# Patient Record
Sex: Female | Born: 1980 | Race: White | Hispanic: No | Marital: Married | Smoking: Current every day smoker
Health system: Southern US, Community
[De-identification: ages and names within clinical notes are randomized; demographics above are authoritative.]

## PROBLEM LIST (undated history)

## (undated) DIAGNOSIS — J45909 Unspecified asthma, uncomplicated: Secondary | ICD-10-CM

## (undated) DIAGNOSIS — N200 Calculus of kidney: Secondary | ICD-10-CM

---
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
## 2003-03-30 ENCOUNTER — Emergency Department (HOSPITAL_COMMUNITY): Admission: EM | Admit: 2003-03-30 | Discharge: 2003-03-30 | Payer: Self-pay | Admitting: Emergency Medicine

## 2004-07-13 ENCOUNTER — Inpatient Hospital Stay (HOSPITAL_COMMUNITY): Admission: EM | Admit: 2004-07-13 | Discharge: 2004-07-16 | Payer: Self-pay | Admitting: Emergency Medicine

## 2011-11-19 ENCOUNTER — Emergency Department (HOSPITAL_COMMUNITY)
Admission: EM | Admit: 2011-11-19 | Discharge: 2011-11-20 | Disposition: A | Discharge status: Home / Self Care | Payer: Self-pay | Attending: Emergency Medicine | Admitting: Emergency Medicine

## 2011-11-19 ENCOUNTER — Encounter (HOSPITAL_COMMUNITY): Payer: Self-pay | Admitting: *Deleted

## 2011-11-19 DIAGNOSIS — R112 Nausea with vomiting, unspecified: Secondary | ICD-10-CM

## 2011-11-19 DIAGNOSIS — J45909 Unspecified asthma, uncomplicated: Secondary | ICD-10-CM | POA: Insufficient documentation

## 2011-11-19 DIAGNOSIS — R197 Diarrhea, unspecified: Secondary | ICD-10-CM | POA: Insufficient documentation

## 2011-11-19 DIAGNOSIS — F17200 Nicotine dependence, unspecified, uncomplicated: Secondary | ICD-10-CM | POA: Insufficient documentation

## 2011-11-19 DIAGNOSIS — Z79899 Other long term (current) drug therapy: Secondary | ICD-10-CM | POA: Insufficient documentation

## 2011-11-19 DIAGNOSIS — R509 Fever, unspecified: Secondary | ICD-10-CM | POA: Insufficient documentation

## 2011-11-19 HISTORY — DX: Unspecified asthma, uncomplicated: J45.909

## 2011-11-19 LAB — URINALYSIS, ROUTINE W REFLEX MICROSCOPIC
Glucose, UA: NEGATIVE mg/dL
Hgb urine dipstick: NEGATIVE
Specific Gravity, Urine: 1.025 (ref 1.005–1.030)

## 2011-11-19 LAB — COMPREHENSIVE METABOLIC PANEL
ALT: 16 U/L (ref 0–35)
AST: 24 U/L (ref 0–37)
Albumin: 4.3 g/dL (ref 3.5–5.2)
Calcium: 9.2 mg/dL (ref 8.4–10.5)
Chloride: 100 mEq/L (ref 96–112)
Creatinine, Ser: 0.7 mg/dL (ref 0.50–1.10)
Sodium: 133 mEq/L — ABNORMAL LOW (ref 135–145)

## 2011-11-19 LAB — DIFFERENTIAL
Basophils Absolute: 0 10*3/uL (ref 0.0–0.1)
Basophils Relative: 0 % (ref 0–1)
Eosinophils Relative: 1 % (ref 0–5)
Monocytes Absolute: 0.4 10*3/uL (ref 0.1–1.0)
Neutro Abs: 9.9 10*3/uL — ABNORMAL HIGH (ref 1.7–7.7)

## 2011-11-19 LAB — CBC
HCT: 41.8 % (ref 36.0–46.0)
MCHC: 34.9 g/dL (ref 30.0–36.0)
Platelets: 312 10*3/uL (ref 150–400)
RDW: 12 % (ref 11.5–15.5)

## 2011-11-19 LAB — PREGNANCY, URINE: Preg Test, Ur: NEGATIVE

## 2011-11-19 LAB — URINE MICROSCOPIC-ADD ON

## 2011-11-19 MED ORDER — ONDANSETRON 8 MG PO TBDP
8.0000 mg | ORAL_TABLET | Freq: Once | ORAL | Status: AC
  Administered 2011-11-19: 8 mg via ORAL
  Filled 2011-11-19: qty 1

## 2011-11-19 MED ORDER — SODIUM CHLORIDE 0.9 % IV BOLUS (SEPSIS)
1000.0000 mL | Freq: Once | INTRAVENOUS | Status: AC
  Administered 2011-11-19: 1000 mL via INTRAVENOUS

## 2011-11-19 NOTE — ED Notes (Signed)
Pt states she started having n/v/d around 10am this morning. Pt states she was on her way to work and had to pull over because of nausea. No blood noted in emesis

## 2011-11-19 NOTE — ED Notes (Addendum)
Pt reports constant bilateral leg aching pain from the knees down. She states that a lot of times when she's sick especially during a fever her legs will ache.

## 2011-11-19 NOTE — ED Notes (Signed)
Pt reports having a kidney infection in 2007 and feeling the same way as she does now.

## 2011-11-19 NOTE — ED Provider Notes (Signed)
History     CSN: [id number]  Arrival date & time 11/19/11  1748   First MD Initiated Contact with Patient 11/19/11 2049      Chief Complaint  Patient presents with  . Fever  . Diarrhea  . Emesis    (Consider location/radiation/quality/duration/timing/severity/associated sxs/prior treatment) HPI Comments: Woke this AM with N/V/D and low grade fever   Patient is a 31 y.o. female presenting with fever, diarrhea, and vomiting.  Fever Primary symptoms of the febrile illness include fever, abdominal pain, nausea, vomiting and diarrhea. Primary symptoms do not include headaches, cough, shortness of breath or dysuria. The current episode started today. This is a new problem.  Diarrhea The primary symptoms include fever, abdominal pain, nausea, vomiting and diarrhea. Primary symptoms do not include dysuria.  The illness is also significant for chills. The illness does not include constipation or back pain.  Emesis  Associated symptoms include abdominal pain, chills, diarrhea and a fever. Pertinent negatives include no cough and no headaches.    Past Medical History  Diagnosis Date  . Asthma     History reviewed. No pertinent past surgical history.  No family history on file.  History  Substance Use Topics  . Smoking status: Current Some Day Smoker  . Smokeless tobacco: Not on file  . Alcohol Use: Yes    OB History    Grav Para Term Preterm Abortions TAB SAB Ect Mult Living                  Review of Systems  Constitutional: Positive for fever and chills.  HENT: Negative for congestion.   Respiratory: Negative for cough and shortness of breath.   Gastrointestinal: Positive for nausea, vomiting, abdominal pain and diarrhea. Negative for constipation.  Genitourinary: Negative for dysuria, frequency and vaginal discharge.  Musculoskeletal: Negative for back pain.  Neurological: Negative for dizziness, weakness and headaches.    Allergies  Erythromycin  Home  Medications   Current Outpatient Rx  Name Route Sig Dispense Refill  . ALBUTEROL SULFATE HFA 108 (90 BASE) MCG/ACT IN AERS Inhalation Inhale 2 puffs into the lungs every 6 (six) hours as needed. For shortness of breath.    . IBUPROFEN 200 MG PO TABS Oral Take 400 mg by mouth every 8 (eight) hours as needed. For pain.    Marland Kitchen PROMETHAZINE HCL 25 MG PO TABS Oral Take 0.5 tablets (12.5 mg total) by mouth every 6 (six) hours as needed for nausea. 30 tablet 0    BP 111/70  Pulse 94  Temp 98.4 F (36.9 C) (Oral)  Resp 17  Ht 5\' 4"  (1.626 m)  Wt 154 lb (69.854 kg)  BMI 26.43 kg/m2  SpO2 98%  LMP 11/10/2011  Physical Exam  Constitutional: She appears well-developed.  Eyes: Pupils are equal, round, and reactive to light.  Neck: Normal range of motion.  Cardiovascular: Tachycardia present.   Pulmonary/Chest: Effort normal.  Abdominal: Soft. She exhibits no distension. Bowel sounds are increased. There is tenderness in the suprapubic area.  Musculoskeletal: Normal range of motion.  Neurological: She is alert.  Skin: Skin is warm. No rash noted. There is pallor.    ED Course  Procedures (including critical care time)  Labs Reviewed  CBC - Abnormal; Notable for the following:    WBC 10.9 (*)     All other components within normal limits  DIFFERENTIAL - Abnormal; Notable for the following:    Neutrophils Relative 90 (*)     Neutro Abs 9.9 (*)  Lymphocytes Relative 5 (*)     Lymphs Abs 0.5 (*)     All other components within normal limits  COMPREHENSIVE METABOLIC PANEL - Abnormal; Notable for the following:    Sodium 133 (*)     Glucose, Bld 105 (*)     All other components within normal limits  URINALYSIS, ROUTINE W REFLEX MICROSCOPIC - Abnormal; Notable for the following:    APPearance CLOUDY (*)     Leukocytes, UA SMALL (*)     All other components within normal limits  URINE MICROSCOPIC-ADD ON - Abnormal; Notable for the following:    Squamous Epithelial / LPF FEW (*)      Bacteria, UA FEW (*)     All other components within normal limits  PREGNANCY, URINE   No results found.   1. Nausea and vomiting in adult      Feeling better tolerating PO  MDM  Will check for UTI         Arman Filter, NP 11/20/11 0141  Arman Filter, NP 11/20/11 4782

## 2011-11-20 MED ORDER — PROMETHAZINE HCL 25 MG PO TABS: 12.5000 mg | ORAL_TABLET | Freq: Four times a day (QID) | ORAL | Status: DC | PRN

## 2011-11-20 NOTE — ED Notes (Signed)
Pt able to tolerate fluids. No emesis.

## 2011-11-20 NOTE — ED Provider Notes (Signed)
Medical screening examination/treatment/procedure(s) were performed by non-physician practitioner and as supervising physician I was immediately available for consultation/collaboration.  Librado Guandique L Tatjana Turcott, MD 11/20/11 2354 

## 2011-11-20 NOTE — Discharge Instructions (Signed)
B.R.A.T. Diet Your doctor has recommended the B.R.A.T. diet for you or your child until the condition improves. This is often used to help control diarrhea and vomiting symptoms. If you or your child can tolerate clear liquids, you may have:  Bananas.   Rice.   Applesauce.   Toast (and other simple starches such as crackers, potatoes, noodles).  Be sure to avoid dairy products, meats, and fatty foods until symptoms are better. Fruit juices such as apple, grape, and prune juice can make diarrhea worse. Avoid these. Continue this diet for 2 days or as instructed by your caregiver. Document Released: 05/18/2005 Document Revised: 05/07/2011 Document Reviewed: 11/04/2006 [facility] Patient Information 2012 [place], [place]. Your labs, and urine were checked, today, indicating no sign of infection.  This is most likely a viral syndrome.  Please try to eat light.  For the next 24-48 hours.  U. been given a prescription for Phenergan that can control your nausea, as well.  Please uses as needed.  1 tablet every 6 hours

## 2011-11-20 NOTE — ED Notes (Signed)
Pt denies being nauseated, but pt reports feeling tired and having a headache.

## 2013-05-02 ENCOUNTER — Ambulatory Visit: Payer: Self-pay | Admitting: Internal Medicine

## 2013-05-02 VITALS — BP 122/64 | HR 111 | Temp 99.3°F | Resp 18 | Ht 67.0 in | Wt 159.0 lb

## 2013-05-02 DIAGNOSIS — J029 Acute pharyngitis, unspecified: Secondary | ICD-10-CM

## 2013-05-02 DIAGNOSIS — J45909 Unspecified asthma, uncomplicated: Secondary | ICD-10-CM | POA: Insufficient documentation

## 2013-05-02 LAB — POCT CBC
Granulocyte percent: 84 %G — AB (ref 37–80)
Hemoglobin: 14 g/dL (ref 12.2–16.2)
Lymph, poc: 1.7 (ref 0.6–3.4)
MCHC: 32 g/dL (ref 31.8–35.4)
MPV: 8.2 fL (ref 0–99.8)
POC Granulocyte: 15 — AB (ref 2–6.9)
POC MID %: 6.2 %M (ref 0–12)
RBC: 4.43 M/uL (ref 4.04–5.48)

## 2013-05-02 LAB — POCT RAPID STREP A (OFFICE): Rapid Strep A Screen: NEGATIVE

## 2013-05-02 MED ORDER — AMOXICILLIN 500 MG PO CAPS: 1000.0000 mg | ORAL_CAPSULE | Freq: Two times a day (BID) | ORAL | Status: AC

## 2013-05-02 NOTE — Progress Notes (Signed)
Subjective:  This chart was scribed for Cheryl Sia, MD by Quintella Reichert, ED scribe.  This patient was seen in room [facility] Room 2 and the patient's care was started at 8:33 AM.   Patient ID: Cheryl Delacruz, female    DOB: 01/04/1981, 32 y.o.   MRN: [id number]   HPI  HPI Comments: Cheryl Delacruz is a 32 y.o. female who presents with a 48-hour history of persistent worsening sore throat with associated headache, fever, and generalized body aches.  Headache is generalized but is severe in the frontal region.  Pt also notes a mild cough but she states this is "more or less from the sore throat."  She works in [employer] but denies known sick contacts.  She did not receive a flu shot this year.  [facility]  Past Medical History  Diagnosis Date   Asthma     Allergies  Allergen Reactions   Erythromycin Hives    Prior to Admission medications   Medication Sig Start Date End Date Taking? Authorizing Provider  ibuprofen (ADVIL,MOTRIN) 200 MG tablet Take 400 mg by mouth every 8 (eight) hours as needed. For pain.   Yes Historical Provider, MD  albuterol (PROVENTIL HFA;VENTOLIN HFA) 108 (90 BASE) MCG/ACT inhaler Inhale 2 puffs into the lungs every 6 (six) hours as needed. For shortness of breath.    Historical Provider, MD  promethazine (PHENERGAN) 25 MG tablet Take 0.5 tablets (12.5 mg total) by mouth every 6 (six) hours as needed for nausea. 11/20/11 11/27/11  Arman Filter, NP      Review of Systems  Constitutional: Positive for fever.  HENT: Positive for sore throat.   Respiratory: Positive for cough.   Gastrointestinal: Negative for vomiting.  Musculoskeletal: Positive for myalgias.  Skin: Negative for rash.  Neurological: Positive for headaches.        Objective:   Physical Exam  Nursing note and vitals reviewed. Constitutional: She appears well-developed and well-nourished. No distress.  HENT:  Head: Normocephalic and atraumatic.  Right Ear:  Tympanic membrane and external ear normal.  Left Ear: Tympanic membrane and external ear normal.  Nose: Nose normal.  Mouth/Throat: Oropharyngeal exudate and posterior oropharyngeal erythema present.  Cardiovascular: Normal rate, regular rhythm and normal heart sounds.   No murmur heard. Pulmonary/Chest: Effort normal and breath sounds normal. No respiratory distress. She has no wheezes. She has no rales.  Lymphadenopathy:    She has no cervical adenopathy.  Neurological: She is alert.  Psychiatric: She has a normal mood and affect. Her behavior is normal.    Filed Vitals:   05/02/13 0822  BP: 122/64  Pulse: 111  Temp: 99.3 F (37.4 C)  TempSrc: Oral  Resp: 18  Height: 5\' 7"  (1.702 m)  Weight: 159 lb (72.122 kg)  SpO2: 99%     Results for orders placed in visit on 05/02/13  POCT RAPID STREP A (OFFICE)      Result Value Range   Rapid Strep A Screen Negative  Negative  POCT CBC      Result Value Range   WBC 17.8 (*) 4.6 - 10.2 K/uL   Lymph, poc 1.7  0.6 - 3.4   POC LYMPH PERCENT 9.8 (*) 10 - 50 %L   MID (cbc) 1.1 (*) 0 - 0.9   POC MID % 6.2  0 - 12 %M   POC Granulocyte 15.0 (*) 2 - 6.9   Granulocyte percent 84.0 (*) 37 - 80 %G   RBC 4.43  4.04 - 5.48 M/uL   Hemoglobin 14.0  12.2 - 16.2 g/dL   HCT, POC 16.1  09.6 - 47.9 %   MCV 98.9 (*) 80 - 97 fL   MCH, POC 31.6 (*) 27 - 31.2 pg   MCHC 32.0  31.8 - 35.4 g/dL   RDW, POC 04.5     Platelet Count, POC 296  142 - 424 K/uL   MPV 8.2  0 - 99.8 fL         Assessment & Plan:   Sore throat - Plan: POCT rapid strep A, Throat culture Loney Loh), POCT CBC  Intrinsic asthma  Meds ordered this encounter  Medications   amoxicillin (AMOXIL) 500 MG capsule    Sig: Take 2 capsules (1,000 mg total) by mouth 2 (two) times daily.    Dispense:  40 capsule    Refill:  0      I have completed the patient encounter in its entirety as documented by the scribe, with editing by me where necessary. Robert P. Merla Riches,  M.D.

## 2013-10-18 ENCOUNTER — Ambulatory Visit: Payer: Self-pay | Admitting: Emergency Medicine

## 2013-10-18 VITALS — BP 116/88 | HR 88 | Temp 98.5°F | Resp 18 | Ht 64.0 in | Wt 165.0 lb

## 2013-10-18 DIAGNOSIS — L0291 Cutaneous abscess, unspecified: Secondary | ICD-10-CM

## 2013-10-18 DIAGNOSIS — L0390 Cellulitis, unspecified: Secondary | ICD-10-CM

## 2013-10-18 MED ORDER — DOXYCYCLINE HYCLATE 100 MG PO CAPS: 100.0000 mg | ORAL_CAPSULE | Freq: Two times a day (BID) | ORAL | Status: DC

## 2013-10-18 NOTE — Progress Notes (Signed)
[facility] [address] [place] [address] [phone]  Date:  10/18/2013   Name:  Cheryl Delacruz   DOB:  1981-05-24   MRN:  [id number]  PCP:  No PCP Per Patient    Chief Complaint: Insect Bite   History of Present Illness:  Cheryl Delacruz is a 33 y.o. very pleasant female patient who presents with the following:  Had an insect bite on her right calf from a week and a half ago.  Now has a "halo" lesion on her right calf.  [facility] and [facility] here.  Not pruritic.  No other rah eruption.  No fever or chills. No improvement with over the counter medications or other home remedies. Denies other complaint or health concern today.   Patient Active Problem List   Diagnosis Date Noted  . Intrinsic asthma 05/02/2013    Past Medical History  Diagnosis Date  . Asthma     History reviewed. No pertinent past surgical history.  History  Substance Use Topics  . Smoking status: Current Some Day Smoker  . Smokeless tobacco: Not on file  . Alcohol Use: Yes    Family History  Problem Relation Age of Onset  . Diabetes Mother   . Heart disease Mother   . Hyperlipidemia Father   . Diabetes Maternal Grandmother   . Heart disease Maternal Grandmother   . Diabetes Paternal Grandfather   . Heart disease Paternal Grandfather   . Hyperlipidemia Paternal Grandfather     Allergies  Allergen Reactions  . Erythromycin Hives    Medication list has been reviewed and updated.  Current Outpatient Prescriptions on File Prior to Visit  Medication Sig Dispense Refill  . ibuprofen (ADVIL,MOTRIN) 200 MG tablet Take 400 mg by mouth every 8 (eight) hours as needed. For pain.      . promethazine (PHENERGAN) 25 MG tablet Take 0.5 tablets (12.5 mg total) by mouth every 6 (six) hours as needed for nausea.  30 tablet  0   No current facility-administered medications on file prior to visit.    Review of Systems:  As per HPI, otherwise negative.     Physical Examination: Filed Vitals:   10/18/13 1657  BP: 116/88  Pulse: 88  Temp: 98.5 F (36.9 C)  Resp: 18   Filed Vitals:   10/18/13 1657  Height: 5\' 4"  (1.626 m)  Weight: 165 lb (74.844 kg)   Body mass index is 28.31 kg/(m^2). Ideal Body Weight: Weight in (lb) to have BMI = 25: 145.3   GEN: WDWN, NAD, Non-toxic, Alert & Oriented x 3 HEENT: Atraumatic, Normocephalic.  Ears and Nose: No external deformity. EXTR: No clubbing/cyanosis/edema NEURO: Normal gait.  PSYCH: Normally interactive. Conversant. Not depressed or anxious appearing.  Calm demeanor.  Quarter sized ecchymotic appearing target shaped lesion calf  Assessment and Plan: Cellulitis doxycylcine  Signed,  Ellison Carwin, MD

## 2013-10-18 NOTE — Patient Instructions (Signed)
lLyme Disease You may have been bitten by a tick and are to watch for the development of Lyme Disease. Lyme Disease is an infection that is caused by a bacteria The bacteria causing this disease is named Borreilia burgdorferi. If a tick is infected with this bacteria and then bites you, then Lyme Disease may occur. These ticks are carried by deer and rodents such as rabbits and mice and infest grassy as well as forested areas. Fortunately most tick bites do not cause Lyme Disease.  Lyme Disease is easier to prevent than to treat. First, covering your legs with clothing when walking in areas where ticks are possibly abundant will prevent their attachment because ticks tend to stay within inches of the ground. Second, using insecticides containing DEET can be applied on skin or clothing. Last, because it takes about 12 to 24 hours for the tick to transmit the disease after attachment to the human host, you should inspect your body for ticks twice a day when you are in areas where Lyme Disease is common. You must look thoroughly when searching for ticks. The Ixodes tick that carries Lyme Disease is very small. It is around the size of a sesame seed (picture of tick is not actual size). Removal is best done by grasping the tick by the head and pulling it out. Do not to squeeze the body of the tick. This could inject the infecting bacteria into the bite site. Wash the area of the bite with an antiseptic solution after removal.  Lyme Disease is a disease that may affect many body systems. Because of the small size of the biting tick, most people do not notice being bitten. The first sign of an infection is usually a round red rash that extends out from the center of the tick bite. The center of the lesion may be blood colored (hemorrhagic) or have tiny blisters (vesicular). Most lesions have bright red outer borders and partial central clearing. This rash may extend out many inches in diameter, and multiple lesions may  be present. Other symptoms such as fatigue, headaches, chills and fever, general achiness and swelling of lymph glands may also occur. If this first stage of the disease is left untreated, these symptoms may gradually resolve by themselves, or progressive symptoms may occur because of spread of infection to other areas of the body.  Follow up with your caregiver to have testing and treatment if you have a tick bite and you develop any of the above complaints. Your caregiver may recommend preventative (prophylactic) medications which kill bacteria (antibiotics). Once a diagnosis of Lyme Disease is made, antibiotic treatment is highly likely to cure the disease. Effective treatment of late stage Lyme Disease may require longer courses of antibiotic therapy.  MAKE SURE YOU:   Understand these instructions.  Will watch your condition.  Will get help right away if you are not doing well or get worse. Document Released: 08/24/2000 Document Revised: 08/10/2011 Document Reviewed: 10/26/2008 [facility] Patient Information 2014 [place], [place]. Cellulitis Cellulitis is an infection of the skin and the tissue beneath it. The infected area is usually red and tender. Cellulitis occurs most often in the arms and lower legs.  CAUSES  Cellulitis is caused by bacteria that enter the skin through cracks or cuts in the skin. The most common types of bacteria that cause cellulitis are Staphylococcus and Streptococcus. SYMPTOMS   Redness and warmth.  Swelling.  Tenderness or pain.  Fever. DIAGNOSIS  Your caregiver can usually determine what  is wrong based on a physical exam. Blood tests may also be done. TREATMENT  Treatment usually involves taking an antibiotic medicine. HOME CARE INSTRUCTIONS   Take your antibiotics as directed. Finish them even if you start to feel better.  Keep the infected arm or leg elevated to reduce swelling.  Apply a warm cloth to the affected area up to 4 times per day to  relieve pain.  Only take over-the-counter or prescription medicines for pain, discomfort, or fever as directed by your caregiver.  Keep all follow-up appointments as directed by your caregiver. SEEK MEDICAL CARE IF:   You notice red streaks coming from the infected area.  Your red area gets larger or turns dark in color.  Your bone or joint underneath the infected area becomes painful after the skin has healed.  Your infection returns in the same area or another area.  You notice a swollen bump in the infected area.  You develop new symptoms. SEEK IMMEDIATE MEDICAL CARE IF:   You have a fever.  You feel very sleepy.  You develop vomiting or diarrhea.  You have a general ill feeling (malaise) with muscle aches and pains. MAKE SURE YOU:   Understand these instructions.  Will watch your condition.  Will get help right away if you are not doing well or get worse. Document Released: 02/25/2005 Document Revised: 11/17/2011 Document Reviewed: 08/03/2011 [facility] Patient Information 2014 [place].

## 2014-06-11 ENCOUNTER — Encounter (HOSPITAL_BASED_OUTPATIENT_CLINIC_OR_DEPARTMENT_OTHER): Payer: Self-pay | Admitting: *Deleted

## 2014-06-11 ENCOUNTER — Emergency Department (HOSPITAL_BASED_OUTPATIENT_CLINIC_OR_DEPARTMENT_OTHER): Payer: Self-pay

## 2014-06-11 ENCOUNTER — Emergency Department (HOSPITAL_BASED_OUTPATIENT_CLINIC_OR_DEPARTMENT_OTHER)
Admission: EM | Admit: 2014-06-11 | Discharge: 2014-06-11 | Disposition: A | Discharge status: Home / Self Care | Payer: Self-pay | Attending: Emergency Medicine | Admitting: Emergency Medicine

## 2014-06-11 DIAGNOSIS — J45901 Unspecified asthma with (acute) exacerbation: Secondary | ICD-10-CM | POA: Insufficient documentation

## 2014-06-11 DIAGNOSIS — Z720 Tobacco use: Secondary | ICD-10-CM | POA: Insufficient documentation

## 2014-06-11 MED ORDER — METHYLPREDNISOLONE SODIUM SUCC 125 MG IJ SOLR
125.0000 mg | Freq: Once | INTRAMUSCULAR | Status: AC
  Administered 2014-06-11: 125 mg via INTRAVENOUS
  Filled 2014-06-11: qty 2

## 2014-06-11 MED ORDER — PREDNISONE 10 MG PO TABS: 20.0000 mg | ORAL_TABLET | Freq: Two times a day (BID) | ORAL | Status: DC

## 2014-06-11 MED ORDER — IPRATROPIUM BROMIDE 0.02 % IN SOLN
0.5000 mg | Freq: Once | RESPIRATORY_TRACT | Status: DC
  Filled 2014-06-11: qty 2.5

## 2014-06-11 MED ORDER — ALBUTEROL SULFATE (2.5 MG/3ML) 0.083% IN NEBU
5.0000 mg | INHALATION_SOLUTION | Freq: Once | RESPIRATORY_TRACT | Status: AC
  Administered 2014-06-11: 5 mg via RESPIRATORY_TRACT
  Filled 2014-06-11: qty 6

## 2014-06-11 MED ORDER — ALBUTEROL SULFATE HFA 108 (90 BASE) MCG/ACT IN AERS
2.0000 | INHALATION_SPRAY | RESPIRATORY_TRACT | Status: DC | PRN
  Administered 2014-06-11: 2 via RESPIRATORY_TRACT
  Filled 2014-06-11: qty 6.7

## 2014-06-11 NOTE — Discharge Instructions (Signed)
Albuterol MDI: 2 puffs every 4 hours as needed for wheezing.  Prednisone as prescribed.  Return to the ER if your symptoms substantially worsen or change.   Asthma Asthma is a recurring condition in which the airways tighten and narrow. Asthma can make it difficult to breathe. It can cause coughing, wheezing, and shortness of breath. Asthma episodes, also called asthma attacks, range from minor to life-threatening. Asthma cannot be cured, but medicines and lifestyle changes can help control it. CAUSES Asthma is believed to be caused by inherited (genetic) and environmental factors, but its exact cause is unknown. Asthma may be triggered by allergens, lung infections, or irritants in the air. Asthma triggers are different for each person. Common triggers include:   Animal dander.  Dust mites.  Cockroaches.  Pollen from trees or grass.  Mold.  Smoke.  Air pollutants such as dust, household cleaners, hair sprays, aerosol sprays, paint fumes, strong chemicals, or strong odors.  Cold air, weather changes, and winds (which increase molds and pollens in the air).  Strong emotional expressions such as crying or laughing hard.  Stress.  Certain medicines (such as aspirin) or types of drugs (such as beta-blockers).  Sulfites in foods and drinks. Foods and drinks that may contain sulfites include dried fruit, potato chips, and sparkling grape juice.  Infections or inflammatory conditions such as the flu, a cold, or an inflammation of the nasal membranes (rhinitis).  Gastroesophageal reflux disease (GERD).  Exercise or strenuous activity. SYMPTOMS Symptoms may occur immediately after asthma is triggered or many hours later. Symptoms include:  Wheezing.  Excessive nighttime or early morning coughing.  Frequent or severe coughing with a common cold.  Chest tightness.  Shortness of breath. DIAGNOSIS  The diagnosis of asthma is made by a review of your medical history and a  physical exam. Tests may also be performed. These may include:  Lung function studies. These tests show how much air you breathe in and out.  Allergy tests.  Imaging tests such as X-rays. TREATMENT  Asthma cannot be cured, but it can usually be controlled. Treatment involves identifying and avoiding your asthma triggers. It also involves medicines. There are 2 classes of medicine used for asthma treatment:   Controller medicines. These prevent asthma symptoms from occurring. They are usually taken every day.  Reliever or rescue medicines. These quickly relieve asthma symptoms. They are used as needed and provide short-term relief. Your health care provider will help you create an asthma action plan. An asthma action plan is a written plan for managing and treating your asthma attacks. It includes a list of your asthma triggers and how they may be avoided. It also includes information on when medicines should be taken and when their dosage should be changed. An action plan may also involve the use of a device called a peak flow meter. A peak flow meter measures how well the lungs are working. It helps you monitor your condition. HOME CARE INSTRUCTIONS   Take medicines only as directed by your health care provider. Speak with your health care provider if you have questions about how or when to take the medicines.  Use a peak flow meter as directed by your health care provider. Record and keep track of readings.  Understand and use the action plan to help minimize or stop an asthma attack without needing to seek medical care.  Control your home environment in the following ways to help prevent asthma attacks:  Do not smoke. Avoid being exposed to  secondhand smoke.  Change your heating and air conditioning filter regularly.  Limit your use of fireplaces and wood stoves.  Get rid of pests (such as roaches and mice) and their droppings.  Throw away plants if you see mold on them.  Clean  your floors and dust regularly. Use unscented cleaning products.  Try to have someone else vacuum for you regularly. Stay out of rooms while they are being vacuumed and for a short while afterward. If you vacuum, use a dust mask from a hardware store, a double-layered or microfilter vacuum cleaner bag, or a vacuum cleaner with a HEPA filter.  Replace carpet with wood, tile, or vinyl flooring. Carpet can trap dander and dust.  Use allergy-proof pillows, mattress covers, and box spring covers.  Wash bed sheets and blankets every week in hot water and dry them in a dryer.  Use blankets that are made of polyester or cotton.  Clean bathrooms and kitchens with bleach. If possible, have someone repaint the walls in these rooms with mold-resistant paint. Keep out of the rooms that are being cleaned and painted.  Wash hands frequently. SEEK MEDICAL CARE IF:   You have wheezing, shortness of breath, or a cough even if taking medicine to prevent attacks.  The colored mucus you cough up (sputum) is thicker than usual.  Your sputum changes from clear or white to yellow, green, gray, or bloody.  You have any problems that may be related to the medicines you are taking (such as a rash, itching, swelling, or trouble breathing).  You are using a reliever medicine more than 2-3 times per week.  Your peak flow is still at 50-79% of your personal best after following your action plan for 1 hour.  You have a fever. SEEK IMMEDIATE MEDICAL CARE IF:   You seem to be getting worse and are unresponsive to treatment during an asthma attack.  You are short of breath even at rest.  You get short of breath when doing very little physical activity.  You have difficulty eating, drinking, or talking due to asthma symptoms.  You develop chest pain.  You develop a fast heartbeat.  You have a bluish color to your lips or fingernails.  You are light-headed, dizzy, or faint.  Your peak flow is less than  50% of your personal best. MAKE SURE YOU:   Understand these instructions.  Will watch your condition.  Will get help right away if you are not doing well or get worse. Document Released: 05/18/2005 Document Revised: 10/02/2013 Document Reviewed: 12/15/2012 [facility] Patient Information 2015 [place], [place]. This information is not intended to replace advice given to you by your health care provider. Make sure you discuss any questions you have with your health care provider.

## 2014-06-11 NOTE — Progress Notes (Signed)
RT instructed patient in the correct use of a MDI with a spacer.  Patient demonstrated the correct technique using a MDI with a spacer.

## 2014-06-11 NOTE — ED Notes (Signed)
Pt reports asthma attack that started tonight.  Cough and URI x 3-4 days.  Denies chest pain. Hx of asthma when she was younger.

## 2014-06-11 NOTE — ED Notes (Signed)
Pt ambulatory to xray. Alert, NAD, calm, interactive.

## 2014-06-11 NOTE — ED Provider Notes (Signed)
CSN: [id number]     Arrival date & time 06/11/14  0033 History   First MD Initiated Contact with Patient 06/11/14 0040     Chief Complaint  Patient presents with  . Asthma     (Consider location/radiation/quality/duration/timing/severity/associated sxs/prior Treatment) HPI Comments: Patient is a 34 year old female with history of asthma that has been dormant for many years. She presents with complaints of congestion and cough for the past several days. She began wheezing this evening and feels short of breath. She denies chest pain or swelling in her legs.  Patient is a 34 y.o. female presenting with asthma. The history is provided by the patient.  Asthma This is a new problem. The current episode started 3 to 5 hours ago. The problem occurs constantly. The problem has been rapidly worsening. Associated symptoms include shortness of breath. Pertinent negatives include no chest pain. Nothing aggravates the symptoms. Nothing relieves the symptoms. She has tried nothing for the symptoms. The treatment provided no relief.    Past Medical History  Diagnosis Date  . Asthma    History reviewed. No pertinent past surgical history. Family History  Problem Relation Age of Onset  . Diabetes Mother   . Heart disease Mother   . Hyperlipidemia Father   . Diabetes Maternal Grandmother   . Heart disease Maternal Grandmother   . Diabetes Paternal Grandfather   . Heart disease Paternal Grandfather   . Hyperlipidemia Paternal Grandfather    History  Substance Use Topics  . Smoking status: Current Some Day Smoker  . Smokeless tobacco: Not on file  . Alcohol Use: Yes   OB History    No data available     Review of Systems  Respiratory: Positive for shortness of breath.   Cardiovascular: Negative for chest pain.  All other systems reviewed and are negative.     Allergies  Erythromycin  Home Medications   Prior to Admission medications   Not on File   BP 143/104 mmHg  Pulse 88   Temp(Src) 97.9 F (36.6 C) (Oral)  Resp 18  SpO2 97%  LMP 06/04/2014 Physical Exam  Constitutional: She is oriented to person, place, and time. She appears well-developed and well-nourished. No distress.  HENT:  Head: Normocephalic and atraumatic.  Neck: Normal range of motion. Neck supple.  Cardiovascular: Normal rate and regular rhythm.  Exam reveals no gallop and no friction rub.   No murmur heard. Pulmonary/Chest: Effort normal. No respiratory distress. She has wheezes. She has no rales.  There are bilateral expiratory wheezes present.  Abdominal: Soft. Bowel sounds are normal. She exhibits no distension. There is no tenderness.  Musculoskeletal: Normal range of motion.  Neurological: She is alert and oriented to person, place, and time.  Skin: Skin is warm and dry. She is not diaphoretic.  Nursing note and vitals reviewed.   ED Course  Procedures (including critical care time) Labs Review Labs Reviewed - No data to display  Imaging Review No results found.   EKG Interpretation None      MDM   Final diagnoses:  None    Patient feeling better after an albuterol treatment and steroids. Discharge with an MDI and prednisone, and return as needed. There is no fever, productive cough, or evidence of bacterial infection on the chest x-ray that I feel warrants an antibiotic. She is to return as needed if her symptoms worsen or change.    Veryl Speak, MD 06/11/14 [phone]

## 2015-04-25 ENCOUNTER — Encounter (HOSPITAL_COMMUNITY): Payer: Self-pay | Admitting: Emergency Medicine

## 2015-04-25 ENCOUNTER — Emergency Department (HOSPITAL_COMMUNITY)
Admission: EM | Admit: 2015-04-25 | Discharge: 2015-04-25 | Disposition: A | Discharge status: Home / Self Care | Payer: Self-pay | Attending: Emergency Medicine | Admitting: Emergency Medicine

## 2015-04-25 DIAGNOSIS — J45909 Unspecified asthma, uncomplicated: Secondary | ICD-10-CM | POA: Insufficient documentation

## 2015-04-25 DIAGNOSIS — Z79899 Other long term (current) drug therapy: Secondary | ICD-10-CM | POA: Insufficient documentation

## 2015-04-25 DIAGNOSIS — F17210 Nicotine dependence, cigarettes, uncomplicated: Secondary | ICD-10-CM | POA: Insufficient documentation

## 2015-04-25 DIAGNOSIS — N12 Tubulo-interstitial nephritis, not specified as acute or chronic: Secondary | ICD-10-CM | POA: Insufficient documentation

## 2015-04-25 DIAGNOSIS — Z3202 Encounter for pregnancy test, result negative: Secondary | ICD-10-CM | POA: Insufficient documentation

## 2015-04-25 LAB — URINE MICROSCOPIC-ADD ON

## 2015-04-25 LAB — BASIC METABOLIC PANEL
ANION GAP: 9 (ref 5–15)
BUN: 16 mg/dL (ref 6–20)
CHLORIDE: 105 mmol/L (ref 101–111)
CO2: 25 mmol/L (ref 22–32)
Calcium: 9.5 mg/dL (ref 8.9–10.3)
Creatinine, Ser: 0.83 mg/dL (ref 0.44–1.00)
GFR calc Af Amer: >60 mL/min (ref >60)
Glucose, Bld: 93 mg/dL (ref 65–99)
POTASSIUM: 3.6 mmol/L (ref 3.5–5.1)
SODIUM: 139 mmol/L (ref 135–145)

## 2015-04-25 LAB — POC URINE PREG, ED: PREG TEST UR: NEGATIVE

## 2015-04-25 LAB — URINALYSIS, ROUTINE W REFLEX MICROSCOPIC
Bilirubin Urine: NEGATIVE
Glucose, UA: NEGATIVE mg/dL
KETONES UR: NEGATIVE mg/dL
NITRITE: NEGATIVE
PH: 6.5 (ref 5.0–8.0)
Protein, ur: NEGATIVE mg/dL
SPECIFIC GRAVITY, URINE: 1.011 (ref 1.005–1.030)

## 2015-04-25 LAB — CBC WITH DIFFERENTIAL/PLATELET
BASOS ABS: 0 10*3/uL (ref 0.0–0.1)
Basophils Relative: 0 %
EOS ABS: 0.7 10*3/uL (ref 0.0–0.7)
EOS PCT: 6 %
HCT: 38.9 % (ref 36.0–46.0)
HEMOGLOBIN: 13.3 g/dL (ref 12.0–15.0)
LYMPHS ABS: 3.1 10*3/uL (ref 0.7–4.0)
LYMPHS PCT: 29 %
MCH: 32 pg (ref 26.0–34.0)
MCHC: 34.2 g/dL (ref 30.0–36.0)
MCV: 93.7 fL (ref 78.0–100.0)
Monocytes Absolute: 1 10*3/uL (ref 0.1–1.0)
Monocytes Relative: 9 %
NEUTROS PCT: 56 %
Neutro Abs: 5.8 10*3/uL (ref 1.7–7.7)
PLATELETS: 376 10*3/uL (ref 150–400)
RBC: 4.15 MIL/uL (ref 3.87–5.11)
RDW: 12.1 % (ref 11.5–15.5)
WBC: 10.6 10*3/uL — AB (ref 4.0–10.5)

## 2015-04-25 MED ORDER — CIPROFLOXACIN HCL 500 MG PO TABS
500.0000 mg | ORAL_TABLET | Freq: Two times a day (BID) | ORAL | Status: DC
Start: 2015-04-25 — End: 2015-08-24

## 2015-04-25 MED ORDER — DEXTROSE 5 % IV SOLN
1.0000 g | Freq: Once | INTRAVENOUS | Status: AC
  Administered 2015-04-25: 1 g via INTRAVENOUS
  Filled 2015-04-25: qty 10

## 2015-04-25 MED ORDER — KETOROLAC TROMETHAMINE 30 MG/ML IJ SOLN
30.0000 mg | Freq: Once | INTRAMUSCULAR | Status: AC
  Administered 2015-04-25: 30 mg via INTRAVENOUS
  Filled 2015-04-25: qty 1

## 2015-04-25 MED ORDER — ONDANSETRON HCL 4 MG/2ML IJ SOLN
4.0000 mg | Freq: Once | INTRAMUSCULAR | Status: AC
  Administered 2015-04-25: 4 mg via INTRAVENOUS
  Filled 2015-04-25: qty 2

## 2015-04-25 MED ORDER — OXYCODONE-ACETAMINOPHEN 5-325 MG PO TABS: 1.0000 | ORAL_TABLET | Freq: Four times a day (QID) | ORAL | Status: DC | PRN

## 2015-04-25 MED ORDER — SODIUM CHLORIDE 0.9 % IV BOLUS (SEPSIS)
1000.0000 mL | Freq: Once | INTRAVENOUS | Status: AC
  Administered 2015-04-25: 1000 mL via INTRAVENOUS

## 2015-04-25 MED ORDER — ONDANSETRON HCL 4 MG PO TABS: 4.0000 mg | ORAL_TABLET | Freq: Four times a day (QID) | ORAL | Status: DC

## 2015-04-25 MED ORDER — MORPHINE SULFATE (PF) 4 MG/ML IV SOLN
4.0000 mg | Freq: Once | INTRAVENOUS | Status: AC
  Administered 2015-04-25: 4 mg via INTRAVENOUS
  Filled 2015-04-25: qty 1

## 2015-04-25 NOTE — ED Provider Notes (Signed)
CSN: [id number]     Arrival date & time 04/25/15  1020 History   First MD Initiated Contact with Patient 04/25/15 1022     Chief Complaint  Patient presents with  . Flank Pain  . Dysuria     (Consider location/radiation/quality/duration/timing/severity/associated sxs/prior Treatment) HPI  Pt to the ED with bilateral flank pain and suprapubic pain with dysuria, urethral burning at end of stream and urinary hesitancy. Her symptoms started last night with some mild bilateral back pain. This morning when she woke up she was having much more severe flank pain a well as the urinary symptoms as described above. She has nausea and has felt subjectively feverish but no vomiting. She denies feeling weak, dizzy, abdominal pain, vaginal discharge or bleeding. Pt appears uncomfortable but overall appears well.   Past Medical History  Diagnosis Date  . Asthma    History reviewed. No pertinent past surgical history. Family History  Problem Relation Age of Onset  . Diabetes Mother   . Heart disease Mother   . Hyperlipidemia Father   . Diabetes Maternal Grandmother   . Heart disease Maternal Grandmother   . Diabetes Paternal Grandfather   . Heart disease Paternal Grandfather   . Hyperlipidemia Paternal Grandfather    Social History  Substance Use Topics  . Smoking status: Current Every Day Smoker -- 0.50 packs/day    Types: Cigarettes  . Smokeless tobacco: None  . Alcohol Use: Yes   OB History    No data available     Review of Systems   ROS: See HPI Constitutional: no fever  Eyes: no drainage  ENT: no runny nose  Cardiovascular: no chest pain  Resp: no SOB  GI: no vomiting GU: + dysuria Integumentary: no rash  Allergy: no hives  Musculoskeletal: no leg swelling  Neurological: no slurred speech ROS otherwise negative   Allergies  Corn-containing products; Erythromycin; Other; and Peanuts  Home Medications   Prior to Admission medications   Medication Sig  Start Date End Date Taking? Authorizing Provider  albuterol (PROVENTIL HFA;VENTOLIN HFA) 108 (90 BASE) MCG/ACT inhaler Inhale 2 puffs into the lungs 2 (two) times daily as needed for shortness of breath.    Yes Historical Provider, MD  cetirizine (ZYRTEC) 10 MG tablet Take 10 mg by mouth daily as needed for allergies.   Yes Historical Provider, MD  clonazePAM (KLONOPIN) 0.5 MG tablet Take 0.5 mg by mouth 2 (two) times daily as needed for anxiety.  03/25/15  Yes Historical Provider, MD  ibuprofen (ADVIL,MOTRIN) 200 MG tablet Take 400 mg by mouth every 6 (six) hours as needed for headache or moderate pain.   Yes Historical Provider, MD  RaNITidine HCl (ZANTAC PO) Take 1 tablet by mouth daily as needed (acid reflux).   Yes Historical Provider, MD  ciprofloxacin (CIPRO) 500 MG tablet Take 1 tablet (500 mg total) by mouth 2 (two) times daily. 04/25/15   Ruel Dimmick Carlota Raspberry, PA-C  ondansetron (ZOFRAN) 4 MG tablet Take 1 tablet (4 mg total) by mouth every 6 (six) hours. 04/25/15   Delos Haring, PA-C  oxyCODONE-acetaminophen (PERCOCET/ROXICET) 5-325 MG tablet Take 1 tablet by mouth every 6 (six) hours as needed for severe pain. 04/25/15   Eliud Polo Carlota Raspberry, PA-C  predniSONE (DELTASONE) 10 MG tablet Take 2 tablets (20 mg total) by mouth 2 (two) times daily. Patient not taking: Reported on 04/25/2015 06/11/14   Veryl Speak, MD   BP 136/107 mmHg  Pulse 84  Temp(Src) 97.7 F (36.5 C) (Oral)  Resp 17  SpO2 98% Physical Exam  Constitutional: She appears well-developed and well-nourished. No distress.  HENT:  Head: Normocephalic and atraumatic.  Eyes: Pupils are equal, round, and reactive to light.  Neck: Normal range of motion. Neck supple.  Cardiovascular: Normal rate and regular rhythm.   Pulmonary/Chest: Effort normal.  Abdominal: Soft. There is tenderness in the suprapubic area. There is CVA tenderness (bilateral). There is no rigidity, no guarding and negative Murphy's sign.  Genitourinary:  Pt declines  pelvic  Neurological: She is alert.  Skin: Skin is warm and dry.  Nursing note and vitals reviewed.   ED Course  Procedures (including critical care time) Labs Review Labs Reviewed  URINALYSIS, ROUTINE W REFLEX MICROSCOPIC (NOT AT [facility]) - Abnormal; Notable for the following:    Hgb urine dipstick SMALL (*)    Leukocytes, UA SMALL (*)    All other components within normal limits  CBC WITH DIFFERENTIAL/PLATELET - Abnormal; Notable for the following:    WBC 10.6 (*)    All other components within normal limits  URINE MICROSCOPIC-ADD ON - Abnormal; Notable for the following:    Squamous Epithelial / LPF 0-5 (*)    Bacteria, UA MANY (*)    All other components within normal limits  URINE CULTURE  BASIC METABOLIC PANEL  POC URINE PREG, ED    Imaging Review No results found. I have personally reviewed and evaluated these images and lab results as part of my medical decision-making.   EKG Interpretation None      MDM   Final diagnoses:  Pyelonephritis   Urinalysis shows MANY bacteria and TOO NUMEROUS TO COUNT WBC with 0-5 squamous cells. Neg nitrite and leukocytes. Normal blood work. Pt given 1 gram IV rocephin and will be written for cipro for 14 days.  Patient offered pelvic but she declines and feels that it is not necessary. Urine culture sent out.  Medications  sodium chloride 0.9 % bolus 1,000 mL (1,000 mLs Intravenous New Bag/Given 04/25/15 1207)  sodium chloride 0.9 % bolus 1,000 mL (0 mLs Intravenous Stopped 04/25/15 1207)  ondansetron (ZOFRAN) injection 4 mg (4 mg Intravenous Given 04/25/15 1103)  morphine 4 MG/ML injection 4 mg (4 mg Intravenous Given 04/25/15 1103)  cefTRIAXone (ROCEPHIN) 1 g in dextrose 5 % 50 mL IVPB (1 g Intravenous New Bag/Given 04/25/15 1208)  ketorolac (TORADOL) 30 MG/ML injection 30 mg (30 mg Intravenous Given 04/25/15 1208)     I feel the patient has had an appropriate workup for their chief complaint at this time and likelihood of  emergent condition existing is low. Discussed s/sx that warrant return to the ED.  Filed Vitals:   04/25/15 1032 04/25/15 1212  BP: 155/108 136/107  Pulse: 91 84  Temp: 97.7 F (36.5 C)   Resp: [address] PA-C 04/25/15 1255  Carmin Muskrat, MD 04/26/15 [phone]

## 2015-04-25 NOTE — Discharge Instructions (Signed)

## 2015-04-25 NOTE — ED Notes (Signed)
Pt c/o bilateral flank pain onset 3 days ago, increased pain onset today at 0600, dysuria, urinary urgency, oliguria, "stings" when urine stream stops, fever. Pt denies use of antipyretics today.

## 2015-04-27 LAB — URINE CULTURE

## 2015-08-24 ENCOUNTER — Ambulatory Visit (INDEPENDENT_AMBULATORY_CARE_PROVIDER_SITE_OTHER): Payer: Self-pay | Admitting: Family Medicine

## 2015-08-24 VITALS — BP 106/64 | HR 111 | Temp 99.6°F | Resp 18 | Ht 65.0 in | Wt 149.0 lb

## 2015-08-24 DIAGNOSIS — J111 Influenza due to unidentified influenza virus with other respiratory manifestations: Secondary | ICD-10-CM

## 2015-08-24 DIAGNOSIS — J4521 Mild intermittent asthma with (acute) exacerbation: Secondary | ICD-10-CM

## 2015-08-24 MED ORDER — HYDROCODONE-HOMATROPINE 5-1.5 MG/5ML PO SYRP: 5.0000 mL | ORAL_SOLUTION | Freq: Three times a day (TID) | ORAL | Status: DC | PRN

## 2015-08-24 MED ORDER — OSELTAMIVIR PHOSPHATE 75 MG PO CAPS: 75.0000 mg | ORAL_CAPSULE | Freq: Two times a day (BID) | ORAL | Status: DC

## 2015-08-24 NOTE — Patient Instructions (Addendum)
You have flu. He needs to stay at work until Tuesday. Please let us know if you're not improving over the next 48 hours.  Take ibuprofen to control the fever and plenty of fluids to keep from getting dehydrated.

## 2015-08-24 NOTE — Progress Notes (Signed)
Subjective:    Patient ID: Cheryl Delacruz, female    DOB: [birth year], 35 y.o.   MRN: [id number] By signing my name below, I, Soijett Blue, attest that this documentation has been prepared under the direction and in the presence of Robyn Haber, MD. Electronically Signed: Soijett Blue, ED Scribe. 08/24/2015. 11:12 AM.  Chief Complaint  Patient presents with   Fever    x 2 days   Generalized Body Aches   Cough   Nasal Congestion    HPI   Cheryl Delacruz is a 35 y.o. female with a PMHx of asthma, who presents to [facility] today complaining of fever onset 2 days worsening last night. She notes that her symptoms began with nasal congestion, cough, and generalized body aches. Pt notes that she got her flu shot this past year. Pt reports that she had pneumonia as a child. She states that she is having associated symptoms of cough, nasal congestion, generalized body aches, appetite change, and wheezing. She states that she has tried OTC tylenol cold and flu last night and ibuprofen this morning for the relief for her symptoms. She denies any other symptoms.  Pt notes that she works at [employer].   Past Medical History  Diagnosis Date   Asthma    Allergies  Allergen Reactions   Corn-Containing Products Hives and Swelling   Erythromycin Hives   Other Hives    Peas    Peanuts [Peanut Oil] Hives and Swelling    Current Outpatient Prescriptions on File Prior to Visit  Medication Sig Dispense Refill   albuterol (PROVENTIL HFA;VENTOLIN HFA) 108 (90 BASE) MCG/ACT inhaler Inhale 2 puffs into the lungs 2 (two) times daily as needed for shortness of breath.      cetirizine (ZYRTEC) 10 MG tablet Take 10 mg by mouth daily as needed for allergies.     clonazePAM (KLONOPIN) 0.5 MG tablet Take 0.5 mg by mouth 2 (two) times daily as needed for anxiety.      ibuprofen (ADVIL,MOTRIN) 200 MG tablet Take 400 mg by mouth every 6 (six) hours as needed for headache or moderate pain.     No  current facility-administered medications on file prior to visit.      Review of Systems  Constitutional: Positive for fever and appetite change. Negative for chills.  HENT: Positive for congestion.   Respiratory: Positive for cough and wheezing.   Musculoskeletal: Positive for myalgias.  All other systems reviewed and are negative.      Objective:   Physical Exam  Constitutional: She is oriented to person, place, and time. She appears well-developed and well-nourished. No distress.  HENT:  Head: Normocephalic and atraumatic.  Mouth/Throat: Posterior oropharyngeal erythema present.  Eyes: EOM are normal.  Neck: Neck supple.  Cardiovascular: Normal rate, regular rhythm and normal heart sounds.   No murmur heard. Pulmonary/Chest: Effort normal. No respiratory distress. She has wheezes.  Bilateral inspiratory and expiratory wheezes.   Musculoskeletal: Normal range of motion.  Neurological: She is alert and oriented to person, place, and time.  Skin: Skin is warm and dry.  Psychiatric: She has a normal mood and affect. Her behavior is normal.  Nursing note and vitals reviewed.        BP 106/64 mmHg   Pulse 111   Temp(Src) 99.6 F (37.6 C)   Resp 18   Ht 5\' 5"  (1.651 m)   Wt 149 lb (67.586 kg)   BMI 24.79 kg/m2   SpO2 96%   LMP 07/22/2015 (  Approximate)  Assessment & Plan:   This chart was scribed in my presence and reviewed by me personally.    ICD-9-CM ICD-10-CM   1. Asthma with acute exacerbation, mild intermittent 493.92 J45.21 oseltamivir (TAMIFLU) 75 MG capsule     HYDROcodone-homatropine (HYCODAN) 5-1.5 MG/5ML syrup  2. Influenza with respiratory manifestation 487.1 J11.1 oseltamivir (TAMIFLU) 75 MG capsule     HYDROcodone-homatropine (HYCODAN) 5-1.5 MG/5ML syrup     Signed, Robyn Haber, MD

## 2015-10-10 ENCOUNTER — Encounter (HOSPITAL_COMMUNITY): Payer: Self-pay

## 2015-10-10 ENCOUNTER — Emergency Department (HOSPITAL_COMMUNITY)
Admission: EM | Admit: 2015-10-10 | Discharge: 2015-10-10 | Disposition: A | Discharge status: Home / Self Care | Payer: Self-pay | Attending: Emergency Medicine | Admitting: Emergency Medicine

## 2015-10-10 ENCOUNTER — Emergency Department (HOSPITAL_COMMUNITY): Payer: Self-pay

## 2015-10-10 ENCOUNTER — Encounter (HOSPITAL_COMMUNITY): Payer: Self-pay | Admitting: *Deleted

## 2015-10-10 DIAGNOSIS — Z791 Long term (current) use of non-steroidal anti-inflammatories (NSAID): Secondary | ICD-10-CM | POA: Insufficient documentation

## 2015-10-10 DIAGNOSIS — F17210 Nicotine dependence, cigarettes, uncomplicated: Secondary | ICD-10-CM | POA: Insufficient documentation

## 2015-10-10 DIAGNOSIS — Z91010 Allergy to peanuts: Secondary | ICD-10-CM | POA: Insufficient documentation

## 2015-10-10 DIAGNOSIS — Z7951 Long term (current) use of inhaled steroids: Secondary | ICD-10-CM | POA: Insufficient documentation

## 2015-10-10 DIAGNOSIS — N23 Unspecified renal colic: Secondary | ICD-10-CM | POA: Insufficient documentation

## 2015-10-10 DIAGNOSIS — Z3202 Encounter for pregnancy test, result negative: Secondary | ICD-10-CM | POA: Insufficient documentation

## 2015-10-10 DIAGNOSIS — Z79899 Other long term (current) drug therapy: Secondary | ICD-10-CM | POA: Insufficient documentation

## 2015-10-10 DIAGNOSIS — J45909 Unspecified asthma, uncomplicated: Secondary | ICD-10-CM | POA: Insufficient documentation

## 2015-10-10 DIAGNOSIS — N83201 Unspecified ovarian cyst, right side: Secondary | ICD-10-CM | POA: Insufficient documentation

## 2015-10-10 DIAGNOSIS — N200 Calculus of kidney: Secondary | ICD-10-CM | POA: Insufficient documentation

## 2015-10-10 DIAGNOSIS — Z79891 Long term (current) use of opiate analgesic: Secondary | ICD-10-CM | POA: Insufficient documentation

## 2015-10-10 HISTORY — DX: Calculus of kidney: N20.0

## 2015-10-10 LAB — CBC WITH DIFFERENTIAL/PLATELET
BASOS PCT: 1 %
Basophils Absolute: 0.1 10*3/uL (ref 0.0–0.1)
EOS ABS: 0.9 10*3/uL — AB (ref 0.0–0.7)
EOS PCT: 9 %
HCT: 38.1 % (ref 36.0–46.0)
Hemoglobin: 13.1 g/dL (ref 12.0–15.0)
LYMPHS ABS: 3.7 10*3/uL (ref 0.7–4.0)
Lymphocytes Relative: 35 %
MCH: 31.4 pg (ref 26.0–34.0)
MCHC: 34.4 g/dL (ref 30.0–36.0)
MCV: 91.4 fL (ref 78.0–100.0)
MONOS PCT: 7 %
Monocytes Absolute: 0.8 10*3/uL (ref 0.1–1.0)
Neutro Abs: 5 10*3/uL (ref 1.7–7.7)
Neutrophils Relative %: 48 %
PLATELETS: 351 10*3/uL (ref 150–400)
RBC: 4.17 MIL/uL (ref 3.87–5.11)
RDW: 13 % (ref 11.5–15.5)
WBC: 10.4 10*3/uL (ref 4.0–10.5)

## 2015-10-10 LAB — URINE MICROSCOPIC-ADD ON

## 2015-10-10 LAB — I-STAT CHEM 8, ED
BUN: 9 mg/dL (ref 6–20)
CALCIUM ION: 1.2 mmol/L (ref 1.12–1.23)
CHLORIDE: 103 mmol/L (ref 101–111)
Creatinine, Ser: 1 mg/dL (ref 0.44–1.00)
GLUCOSE: 94 mg/dL (ref 65–99)
HCT: 40 % (ref 36.0–46.0)
Hemoglobin: 13.6 g/dL (ref 12.0–15.0)
Potassium: 3.7 mmol/L (ref 3.5–5.1)
SODIUM: 139 mmol/L (ref 135–145)
TCO2: 22 mmol/L (ref 0–100)

## 2015-10-10 LAB — URINALYSIS, ROUTINE W REFLEX MICROSCOPIC
BILIRUBIN URINE: NEGATIVE
Glucose, UA: NEGATIVE mg/dL
KETONES UR: NEGATIVE mg/dL
Leukocytes, UA: NEGATIVE
NITRITE: NEGATIVE
Protein, ur: NEGATIVE mg/dL
SPECIFIC GRAVITY, URINE: 1.004 — AB (ref 1.005–1.030)
pH: 6.5 (ref 5.0–8.0)

## 2015-10-10 LAB — POC URINE PREG, ED: PREG TEST UR: NEGATIVE

## 2015-10-10 MED ORDER — MORPHINE SULFATE (PF) 4 MG/ML IV SOLN
4.0000 mg | Freq: Once | INTRAVENOUS | Status: AC
  Administered 2015-10-10: 4 mg via INTRAVENOUS
  Filled 2015-10-10: qty 1

## 2015-10-10 MED ORDER — KETOROLAC TROMETHAMINE 30 MG/ML IJ SOLN
30.0000 mg | Freq: Once | INTRAMUSCULAR | Status: AC
  Administered 2015-10-10: 30 mg via INTRAVENOUS
  Filled 2015-10-10: qty 1

## 2015-10-10 MED ORDER — METOCLOPRAMIDE HCL 5 MG/ML IJ SOLN
5.0000 mg | Freq: Once | INTRAMUSCULAR | Status: AC
  Administered 2015-10-10: 5 mg via INTRAVENOUS
  Filled 2015-10-10: qty 2

## 2015-10-10 MED ORDER — SODIUM CHLORIDE 0.9 % IV SOLN
INTRAVENOUS | Status: DC
  Administered 2015-10-10: 125 mL/h via INTRAVENOUS

## 2015-10-10 MED ORDER — LORAZEPAM 2 MG/ML IJ SOLN
0.5000 mg | Freq: Once | INTRAMUSCULAR | Status: AC
  Administered 2015-10-10: 0.5 mg via INTRAVENOUS
  Filled 2015-10-10: qty 1

## 2015-10-10 MED ORDER — HYDROMORPHONE HCL 1 MG/ML IJ SOLN
1.0000 mg | Freq: Once | INTRAMUSCULAR | Status: AC
  Administered 2015-10-10: 1 mg via INTRAVENOUS
  Filled 2015-10-10: qty 1

## 2015-10-10 MED ORDER — OXYCODONE-ACETAMINOPHEN 5-325 MG PO TABS: 2.0000 | ORAL_TABLET | Freq: Four times a day (QID) | ORAL | Status: DC | PRN

## 2015-10-10 MED ORDER — HYDROMORPHONE HCL 1 MG/ML IJ SOLN
1.0000 mg | Freq: Once | INTRAMUSCULAR | Status: AC | PRN
  Administered 2015-10-10: 1 mg via INTRAVENOUS
  Filled 2015-10-10: qty 1

## 2015-10-10 MED ORDER — ONDANSETRON HCL 4 MG PO TABS: 4.0000 mg | ORAL_TABLET | Freq: Four times a day (QID) | ORAL | Status: DC

## 2015-10-10 MED ORDER — DIPHENHYDRAMINE HCL 50 MG/ML IJ SOLN
12.5000 mg | Freq: Once | INTRAMUSCULAR | Status: AC
  Administered 2015-10-10: 12.5 mg via INTRAVENOUS
  Filled 2015-10-10: qty 1

## 2015-10-10 MED ORDER — LORAZEPAM 2 MG/ML IJ SOLN
1.0000 mg | Freq: Once | INTRAMUSCULAR | Status: AC
  Administered 2015-10-10: 1 mg via INTRAVENOUS
  Filled 2015-10-10: qty 1

## 2015-10-10 NOTE — ED Provider Notes (Signed)
CSN: [id number]     Arrival date & time 10/10/15  0148 History   First MD Initiated Contact with Patient 10/10/15 0154     Chief Complaint  Patient presents with  . Flank Pain     (Consider location/radiation/quality/duration/timing/severity/associated sxs/prior Treatment) HPI Comments: Patient presents to the emergency department with chief complaint of sudden onset right flank pain that started tonight at 6 PM. She states that it radiates to the right side of her lower abdomen. She denies any associated dysuria or hematuria. She denies any fevers or chills. She states that she has had a kidney infection the past, this feels similar but is more painful, and was more sudden in onset. She denies any other medical problems. She has not tried taking anything for her symptoms. There are no modifying factors. She denies any nausea, or vomiting.  The history is provided by the patient. No language interpreter was used.    Past Medical History  Diagnosis Date  . Asthma    History reviewed. No pertinent past surgical history. Family History  Problem Relation Age of Onset  . Diabetes Mother   . Heart disease Mother   . Hyperlipidemia Father   . Diabetes Maternal Grandmother   . Heart disease Maternal Grandmother   . Diabetes Paternal Grandfather   . Heart disease Paternal Grandfather   . Hyperlipidemia Paternal Grandfather    Social History  Substance Use Topics  . Smoking status: Current Every Day Smoker -- 0.50 packs/day    Types: Cigarettes  . Smokeless tobacco: None  . Alcohol Use: Yes   OB History    No data available     Review of Systems  Constitutional: Negative for fever and chills.  Respiratory: Negative for shortness of breath.   Cardiovascular: Negative for chest pain.  Gastrointestinal: Negative for nausea, vomiting, diarrhea and constipation.  Genitourinary: Positive for hematuria and flank pain. Negative for dysuria.  All other systems reviewed and are  negative.     Allergies  Corn-containing products; Erythromycin; Other; and Peanuts  Home Medications   Prior to Admission medications   Medication Sig Start Date End Date Taking? Authorizing Provider  albuterol (PROVENTIL HFA;VENTOLIN HFA) 108 (90 BASE) MCG/ACT inhaler Inhale 2 puffs into the lungs 2 (two) times daily as needed for shortness of breath.    Yes Historical Provider, MD  cetirizine (ZYRTEC) 10 MG tablet Take 10 mg by mouth daily as needed for allergies.   Yes Historical Provider, MD  clonazePAM (KLONOPIN) 0.5 MG tablet Take 0.5 mg by mouth 2 (two) times daily as needed for anxiety.  03/25/15  Yes Historical Provider, MD  CRANBERRY PO Take 2 tablets by mouth once.   Yes Historical Provider, MD  ibuprofen (ADVIL,MOTRIN) 200 MG tablet Take 600 mg by mouth every 6 (six) hours as needed for headache or moderate pain.    Yes Historical Provider, MD  HYDROcodone-homatropine (HYCODAN) 5-1.5 MG/5ML syrup Take 5 mLs by mouth every 8 (eight) hours as needed for cough. Patient not taking: Reported on 10/10/2015 08/24/15   Robyn Haber, MD  oseltamivir (TAMIFLU) 75 MG capsule Take 1 capsule (75 mg total) by mouth 2 (two) times daily. Patient not taking: Reported on 10/10/2015 08/24/15   Robyn Haber, MD   BP 152/107 mmHg  Pulse 91  Temp(Src) 98.1 F (36.7 C) (Oral)  Resp 18  Ht 5\' 4"  (1.626 m)  Wt 68.04 kg  BMI 25.73 kg/m2  SpO2 100%  LMP 10/08/2015 Physical Exam  Constitutional: She is oriented  to person, place, and time. She appears well-developed and well-nourished.  HENT:  Head: Normocephalic and atraumatic.  Eyes: Conjunctivae and EOM are normal. Pupils are equal, round, and reactive to light.  Neck: Normal range of motion. Neck supple.  Cardiovascular: Normal rate and regular rhythm.  Exam reveals no gallop and no friction rub.   No murmur heard. Pulmonary/Chest: Effort normal and breath sounds normal. No respiratory distress. She has no wheezes. She has no rales. She  exhibits no tenderness.  Abdominal: Soft. Bowel sounds are normal. She exhibits no distension and no mass. There is no tenderness. There is no rebound and no guarding.  No focal abdominal tenderness, no RLQ tenderness or pain at McBurney's point, no RUQ tenderness or Murphy's sign, no left-sided abdominal tenderness, no fluid wave, or signs of peritonitis   Musculoskeletal: Normal range of motion. She exhibits no edema or tenderness.  Neurological: She is alert and oriented to person, place, and time.  Skin: Skin is warm and dry.  Psychiatric: She has a normal mood and affect. Her behavior is normal. Judgment and thought content normal.  Nursing note and vitals reviewed.   ED Course  Procedures (including critical care time) Results for orders placed or performed during the hospital encounter of 10/10/15  Urinalysis, Routine w reflex microscopic- may I&O cath if menses  Result Value Ref Range   Color, Urine YELLOW YELLOW   APPearance CLEAR CLEAR   Specific Gravity, Urine 1.004 (L) 1.005 - 1.030   pH 6.5 5.0 - 8.0   Glucose, UA NEGATIVE NEGATIVE mg/dL   Hgb urine dipstick LARGE (A) NEGATIVE   Bilirubin Urine NEGATIVE NEGATIVE   Ketones, ur NEGATIVE NEGATIVE mg/dL   Protein, ur NEGATIVE NEGATIVE mg/dL   Nitrite NEGATIVE NEGATIVE   Leukocytes, UA NEGATIVE NEGATIVE  CBC with Differential/Platelet  Result Value Ref Range   WBC 10.4 4.0 - 10.5 K/uL   RBC 4.17 3.87 - 5.11 MIL/uL   Hemoglobin 13.1 12.0 - 15.0 g/dL   HCT 38.1 36.0 - 46.0 %   MCV 91.4 78.0 - 100.0 fL   MCH 31.4 26.0 - 34.0 pg   MCHC 34.4 30.0 - 36.0 g/dL   RDW 13.0 11.5 - 15.5 %   Platelets 351 150 - 400 K/uL   Neutrophils Relative % 48 %   Neutro Abs 5.0 1.7 - 7.7 K/uL   Lymphocytes Relative 35 %   Lymphs Abs 3.7 0.7 - 4.0 K/uL   Monocytes Relative 7 %   Monocytes Absolute 0.8 0.1 - 1.0 K/uL   Eosinophils Relative 9 %   Eosinophils Absolute 0.9 (H) 0.0 - 0.7 K/uL   Basophils Relative 1 %   Basophils Absolute 0.1  0.0 - 0.1 K/uL  Urine microscopic-add on  Result Value Ref Range   Squamous Epithelial / LPF 0-5 (A) NONE SEEN   WBC, UA 0-5 0 - 5 WBC/hpf   RBC / HPF TOO NUMEROUS TO COUNT 0 - 5 RBC/hpf   Bacteria, UA RARE (A) NONE SEEN  POC urine preg, ED  Result Value Ref Range   Preg Test, Ur NEGATIVE NEGATIVE  I-stat chem 8, ed  Result Value Ref Range   Sodium 139 135 - 145 mmol/L   Potassium 3.7 3.5 - 5.1 mmol/L   Chloride 103 101 - 111 mmol/L   BUN 9 6 - 20 mg/dL   Creatinine, Ser 1.00 0.44 - 1.00 mg/dL   Glucose, Bld 94 65 - 99 mg/dL   Calcium, Ion 1.20 1.12 - 1.23  mmol/L   TCO2 22 0 - 100 mmol/L   Hemoglobin 13.6 12.0 - 15.0 g/dL   HCT 40.0 36.0 - 46.0 %   Ct Renal Stone Study  10/10/2015  CLINICAL DATA:  RIGHT flank pain beginning at 6 p.m. EXAM: CT ABDOMEN AND PELVIS WITHOUT CONTRAST TECHNIQUE: Multidetector CT imaging of the abdomen and pelvis was performed following the standard protocol without IV contrast. COMPARISON:  CT abdomen and pelvis July 13, 2004 FINDINGS: LUNG BASES:  RIGHT middle lobe and lingular atelectasis. KIDNEYS/BLADDER: Kidneys are orthotopic, demonstrating normal size and morphology. Mild RIGHT hydroureteronephrosis the level of the proximally ureter where a 2 mm calculus is present. No nephrolithiasis; limited assessment for renal masses on this nonenhanced examination. The unopacified ureters are normal in course and caliber. Urinary bladder is partially distended and unremarkable. SOLID ORGANS: The liver, spleen, gallbladder, pancreas and adrenal glands are unremarkable for this non-contrast examination. GASTROINTESTINAL TRACT: The stomach, small and large bowel are normal in course and caliber without inflammatory changes, the sensitivity may be decreased by lack of enteric contrast. Moderate amount of retained large bowel stool. Normal appendix. PERITONEUM/RETROPERITONEUM: Aortoiliac vessels are normal in course and caliber. No lymphadenopathy by CT size criteria.  Homogeneously dense enlarged 6.6 x 4.2 cm RIGHT ovary. Lobulated uterine fundal contour most consistent with leiomyomata. No intraperitoneal free fluid nor free air. SOFT TISSUES/ OSSEOUS STRUCTURES:  Nonsuspicious. IMPRESSION: 2 mm proximal RIGHT ureteral calculus resulting in mild obstructive uropathy. Enlarged mildly dense RIGHT adnexa suggest hemorrhagic cyst or endometrium though would be better characterized on pelvic ultrasound. Electronically Signed   By: Elon Alas M.D.   On: 10/10/2015 03:27    I have personally reviewed and evaluated these images and lab results as part of my medical decision-making.    MDM   Final diagnoses:  Kidney stone  Cyst of right ovary    Patient with right flank pain and hematuria concerning for kidney stone. Urine pregnancy test is negative. Will check CT of abdomen. Will treat with Toradol. Will reassess.  Dilaudid provided excellent pain control.  Pain is now a 1/10.  Will DC to home.  Informed patient of cyst seen on CT and encouraged follow-up with OBGYN for [place] in 6-12 weeks.  Return precautions discussed.  She feels stable and ready for discharge.    Montine Circle, PA-C 10/10/15 [id number]  Merryl Hacker, MD 10/10/15 [phone]

## 2015-10-10 NOTE — Discharge Instructions (Signed)
You have been diagnosed with a kidney stone.  You need to strain your urine.  If you do not pass the stone in 48 hours, please contact the urologist listed above.  If you have fever, vomiting, or uncontrolled pain please return to the ER.  You also have a small cyst on your ovary.  Please follow-up with your OBGYN or Primary Care Doctor in 6-12 weeks to have an ultrasound to ensure resolution.   Kidney Stones Kidney stones (urolithiasis) are deposits that form inside your kidneys. The intense pain is caused by the stone moving through the urinary tract. When the stone moves, the ureter goes into spasm around the stone. The stone is usually passed in the urine.  CAUSES   A disorder that makes certain neck glands produce too much parathyroid hormone (primary hyperparathyroidism).  A buildup of uric acid crystals, similar to gout in your joints.  Narrowing (stricture) of the ureter.  A kidney obstruction present at birth (congenital obstruction).  Previous surgery on the kidney or ureters.  Numerous kidney infections. SYMPTOMS   Feeling sick to your stomach (nauseous).  Throwing up (vomiting).  Blood in the urine (hematuria).  Pain that usually spreads (radiates) to the groin.  Frequency or urgency of urination. DIAGNOSIS   Taking a history and physical exam.  Blood or urine tests.  CT scan.  Occasionally, an examination of the inside of the urinary bladder (cystoscopy) is performed. TREATMENT   Observation.  Increasing your fluid intake.  Extracorporeal shock wave lithotripsy--This is a noninvasive procedure that uses shock waves to break up kidney stones.  Surgery may be needed if you have severe pain or persistent obstruction. There are various surgical procedures. Most of the procedures are performed with the use of small instruments. Only small incisions are needed to accommodate these instruments, so recovery time is minimized. The size, location, and chemical  composition are all important variables that will determine the proper choice of action for you. Talk to your health care provider to better understand your situation so that you will minimize the risk of injury to yourself and your kidney.  HOME CARE INSTRUCTIONS   Drink enough water and fluids to keep your urine clear or pale yellow. This will help you to pass the stone or stone fragments.  Strain all urine through the provided strainer. Keep all particulate matter and stones for your health care provider to see. The stone causing the pain may be as small as a grain of salt. It is very important to use the strainer each and every time you pass your urine. The collection of your stone will allow your health care provider to analyze it and verify that a stone has actually passed. The stone analysis will often identify what you can do to reduce the incidence of recurrences.  Only take over-the-counter or prescription medicines for pain, discomfort, or fever as directed by your health care provider.  Keep all follow-up visits as told by your health care provider. This is important.  Get follow-up X-rays if required. The absence of pain does not always mean that the stone has passed. It may have only stopped moving. If the urine remains completely obstructed, it can cause loss of kidney function or even complete destruction of the kidney. It is your responsibility to make sure X-rays and follow-ups are completed. Ultrasounds of the kidney can show blockages and the status of the kidney. Ultrasounds are not associated with any radiation and can be performed easily in a  matter of minutes.  Make changes to your daily diet as told by your health care provider. You may be told to:  Limit the amount of salt that you eat.  Eat 5 or more servings of fruits and vegetables each day.  Limit the amount of meat, poultry, fish, and eggs that you eat.  Collect a 24-hour urine sample as told by your health care  provider.You may need to collect another urine sample every 6-12 months. SEEK MEDICAL CARE IF:  You experience pain that is progressive and unresponsive to any pain medicine you have been prescribed. SEEK IMMEDIATE MEDICAL CARE IF:   Pain cannot be controlled with the prescribed medicine.  You have a fever or shaking chills.  The severity or intensity of pain increases over 18 hours and is not relieved by pain medicine.  You develop a new onset of abdominal pain.  You feel faint or pass out.  You are unable to urinate.   This information is not intended to replace advice given to you by your health care provider. Make sure you discuss any questions you have with your health care provider.   Document Released: 05/18/2005 Document Revised: 02/06/2015 Document Reviewed: 10/19/2012 Elsevier Interactive Patient Education 2016 Elsevier Inc[employer] Ovarian Cyst An ovarian cyst is a fluid-filled sac that forms on an ovary. The ovaries are small organs that produce eggs in women. Various types of cysts can form on the ovaries. Most are not cancerous. Many do not cause problems, and they often go away on their own. Some may cause symptoms and require treatment. Common types of ovarian cysts include:  Functional cysts--These cysts may occur every month during the menstrual cycle. This is normal. The cysts usually go away with the next menstrual cycle if the woman does not get pregnant. Usually, there are no symptoms with a functional cyst.  Endometrioma cysts--These cysts form from the tissue that lines the uterus. They are also called "chocolate cysts" because they become filled with blood that turns brown. This type of cyst can cause pain in the lower abdomen during intercourse and with your menstrual period.  Cystadenoma cysts--This type develops from the cells on the outside of the ovary. These cysts can get very big and cause lower abdomen pain and pain with intercourse. This type of cyst can twist  on itself, cut off its blood supply, and cause severe pain. It can also easily rupture and cause a lot of pain.  Dermoid cysts--This type of cyst is sometimes found in both ovaries. These cysts may contain different kinds of body tissue, such as skin, teeth, hair, or cartilage. They usually do not cause symptoms unless they get very big.  Theca lutein cysts--These cysts occur when too much of a certain hormone (human chorionic gonadotropin) is produced and overstimulates the ovaries to produce an egg. This is most common after procedures used to assist with the conception of a baby (in vitro fertilization). CAUSES   Fertility drugs can cause a condition in which multiple large cysts are formed on the ovaries. This is called ovarian hyperstimulation syndrome.  A condition called polycystic ovary syndrome can cause hormonal imbalances that can lead to nonfunctional ovarian cysts. SIGNS AND SYMPTOMS  Many ovarian cysts do not cause symptoms. If symptoms are present, they may include:  Pelvic pain or pressure.  Pain in the lower abdomen.  Pain during sexual intercourse.  Increasing girth (swelling) of the abdomen.  Abnormal menstrual periods.  Increasing pain with menstrual periods.  Stopping having  menstrual periods without being pregnant. DIAGNOSIS  These cysts are commonly found during a routine or annual pelvic exam. Tests may be ordered to find out more about the cyst. These tests may include:  Ultrasound.  X-ray of the pelvis.  CT scan.  MRI.  Blood tests. TREATMENT  Many ovarian cysts go away on their own without treatment. Your health care provider may want to check your cyst regularly for 2-3 months to see if it changes. For women in menopause, it is particularly important to monitor a cyst closely because of the higher rate of ovarian cancer in menopausal women. When treatment is needed, it may include any of the following:  A procedure to drain the cyst (aspiration).  This may be done using a long needle and ultrasound. It can also be done through a laparoscopic procedure. This involves using a thin, lighted tube with a tiny camera on the end (laparoscope) inserted through a small incision.  Surgery to remove the whole cyst. This may be done using laparoscopic surgery or an open surgery involving a larger incision in the lower abdomen.  Hormone treatment or birth control pills. These methods are sometimes used to help dissolve a cyst. HOME CARE INSTRUCTIONS   Only take over-the-counter or prescription medicines as directed by your health care provider.  Follow up with your health care provider as directed.  Get regular pelvic exams and Pap tests. SEEK MEDICAL CARE IF:   Your periods are late, irregular, or painful, or they stop.  Your pelvic pain or abdominal pain does not go away.  Your abdomen becomes larger or swollen.  You have pressure on your bladder or trouble emptying your bladder completely.  You have pain during sexual intercourse.  You have feelings of fullness, pressure, or discomfort in your stomach.  You lose weight for no apparent reason.  You feel generally ill.  You become constipated.  You lose your appetite.  You develop acne.  You have an increase in body and facial hair.  You are gaining weight, without changing your exercise and eating habits.  You think you are pregnant. SEEK IMMEDIATE MEDICAL CARE IF:   You have increasing abdominal pain.  You feel sick to your stomach (nauseous), and you throw up (vomit).  You develop a fever that comes on suddenly.  You have abdominal pain during a bowel movement.  Your menstrual periods become heavier than usual. MAKE SURE YOU:  Understand these instructions.  Will watch your condition.  Will get help right away if you are not doing well or get worse.   This information is not intended to replace advice given to you by your health care provider. Make sure you  discuss any questions you have with your health care provider.   Document Released: 05/18/2005 Document Revised: 05/23/2013 Document Reviewed: 01/23/2013 Elsevier Interactive Patient Education [employer].

## 2015-10-10 NOTE — ED Notes (Signed)
MD at bedside. 

## 2015-10-10 NOTE — ED Notes (Signed)
Onset of right flank pain at 6pm tonight, no nausea/vomiting

## 2015-10-10 NOTE — ED Provider Notes (Signed)
CSN: [id number]     Arrival date & time 10/10/15  1555 History   First MD Initiated Contact with Patient 10/10/15 1609     Chief Complaint  Patient presents with  . Abdominal Pain  . Flank Pain    right side  . Nausea  . Emesis     (Consider location/radiation/quality/duration/timing/severity/associated sxs/prior Treatment) HPI Comments: Patient here complaining of right-sided flank pain 2 days. Seen here less than 24 hours ago she was diagnosed with a 2 mm right sided kidney stone. She has had some nausea and vomiting due to the pain. Denies any fever or chills. Denies any dysuria. No frank bloody urine. No vaginal bleeding or discharge. Pain is been colicky and unrelieved with her home meds.  Patient is a 35 y.o. female presenting with abdominal pain, flank pain, and vomiting. The history is provided by the patient.  Abdominal Pain Associated symptoms: vomiting   Flank Pain Associated symptoms include abdominal pain.  Emesis Associated symptoms: abdominal pain     Past Medical History  Diagnosis Date  . Asthma   . Kidney stone    History reviewed. No pertinent past surgical history. Family History  Problem Relation Age of Onset  . Diabetes Mother   . Heart disease Mother   . Hyperlipidemia Father   . Diabetes Maternal Grandmother   . Heart disease Maternal Grandmother   . Diabetes Paternal Grandfather   . Heart disease Paternal Grandfather   . Hyperlipidemia Paternal Grandfather    Social History  Substance Use Topics  . Smoking status: Current Every Day Smoker -- 0.50 packs/day    Types: Cigarettes  . Smokeless tobacco: None  . Alcohol Use: Yes   OB History    No data available     Review of Systems  Gastrointestinal: Positive for vomiting and abdominal pain.  Genitourinary: Positive for flank pain.  All other systems reviewed and are negative.     Allergies  Corn-containing products; Erythromycin; Other; and Peanuts  Home Medications   Prior to  Admission medications   Medication Sig Start Date End Date Taking? Authorizing Provider  albuterol (PROVENTIL HFA;VENTOLIN HFA) 108 (90 BASE) MCG/ACT inhaler Inhale 2 puffs into the lungs 2 (two) times daily as needed for shortness of breath.     Historical Provider, MD  cetirizine (ZYRTEC) 10 MG tablet Take 10 mg by mouth daily as needed for allergies.    Historical Provider, MD  clonazePAM (KLONOPIN) 0.5 MG tablet Take 0.5 mg by mouth 2 (two) times daily as needed for anxiety.  03/25/15   Historical Provider, MD  CRANBERRY PO Take 2 tablets by mouth once.    Historical Provider, MD  HYDROcodone-homatropine (HYCODAN) 5-1.5 MG/5ML syrup Take 5 mLs by mouth every 8 (eight) hours as needed for cough. Patient not taking: Reported on 10/10/2015 08/24/15   Robyn Haber, MD  ibuprofen (ADVIL,MOTRIN) 200 MG tablet Take 600 mg by mouth every 6 (six) hours as needed for headache or moderate pain.     Historical Provider, MD  ondansetron (ZOFRAN) 4 MG tablet Take 1 tablet (4 mg total) by mouth every 6 (six) hours. 10/10/15   Montine Circle, PA-C  oseltamivir (TAMIFLU) 75 MG capsule Take 1 capsule (75 mg total) by mouth 2 (two) times daily. Patient not taking: Reported on 10/10/2015 08/24/15   Robyn Haber, MD  oxyCODONE-acetaminophen (PERCOCET/ROXICET) 5-325 MG tablet Take 2 tablets by mouth every 6 (six) hours as needed for severe pain. 10/10/15   Montine Circle, PA-C   BP  139/100 mmHg  Pulse 84  Temp(Src) 97.6 F (36.4 C) (Oral)  Resp 20  Ht 5\' 4"  (1.626 m)  Wt 68.04 kg  BMI 25.73 kg/m2  SpO2 99%  LMP 10/08/2015 Physical Exam  Constitutional: She is oriented to person, place, and time. She appears well-developed and well-nourished.  Non-toxic appearance. No distress.  HENT:  Head: Normocephalic and atraumatic.  Eyes: Conjunctivae, EOM and lids are normal. Pupils are equal, round, and reactive to light.  Neck: Normal range of motion. Neck supple. No tracheal deviation present. No thyroid mass  present.  Cardiovascular: Normal rate, regular rhythm and normal heart sounds.  Exam reveals no gallop.   No murmur heard. Pulmonary/Chest: Effort normal and breath sounds normal. No stridor. No respiratory distress. She has no decreased breath sounds. She has no wheezes. She has no rhonchi. She has no rales.  Abdominal: Soft. Normal appearance and bowel sounds are normal. She exhibits no distension. There is no tenderness. There is no rebound and no CVA tenderness.  Musculoskeletal: Normal range of motion. She exhibits no edema or tenderness.  Neurological: She is alert and oriented to person, place, and time. She has normal strength. No cranial nerve deficit or sensory deficit. GCS eye subscore is 4. GCS verbal subscore is 5. GCS motor subscore is 6.  Skin: Skin is warm and dry. No abrasion and no rash noted.  Psychiatric: She has a normal mood and affect. Her speech is normal and behavior is normal.  Nursing note and vitals reviewed.   ED Course  Procedures (including critical care time) Labs Review Labs Reviewed - No data to display  Imaging Review Ct Renal Stone Study  10/10/2015  CLINICAL DATA:  RIGHT flank pain beginning at 6 p.m. EXAM: CT ABDOMEN AND PELVIS WITHOUT CONTRAST TECHNIQUE: Multidetector CT imaging of the abdomen and pelvis was performed following the standard protocol without IV contrast. COMPARISON:  CT abdomen and pelvis July 13, 2004 FINDINGS: LUNG BASES:  RIGHT middle lobe and lingular atelectasis. KIDNEYS/BLADDER: Kidneys are orthotopic, demonstrating normal size and morphology. Mild RIGHT hydroureteronephrosis the level of the proximally ureter where a 2 mm calculus is present. No nephrolithiasis; limited assessment for renal masses on this nonenhanced examination. The unopacified ureters are normal in course and caliber. Urinary bladder is partially distended and unremarkable. SOLID ORGANS: The liver, spleen, gallbladder, pancreas and adrenal glands are unremarkable  for this non-contrast examination. GASTROINTESTINAL TRACT: The stomach, small and large bowel are normal in course and caliber without inflammatory changes, the sensitivity may be decreased by lack of enteric contrast. Moderate amount of retained large bowel stool. Normal appendix. PERITONEUM/RETROPERITONEUM: Aortoiliac vessels are normal in course and caliber. No lymphadenopathy by CT size criteria. Homogeneously dense enlarged 6.6 x 4.2 cm RIGHT ovary. Lobulated uterine fundal contour most consistent with leiomyomata. No intraperitoneal free fluid nor free air. SOFT TISSUES/ OSSEOUS STRUCTURES:  Nonsuspicious. IMPRESSION: 2 mm proximal RIGHT ureteral calculus resulting in mild obstructive uropathy. Enlarged mildly dense RIGHT adnexa suggest hemorrhagic cyst or endometrium though would be better characterized on pelvic ultrasound. Electronically Signed   By: Elon Alas M.D.   On: 10/10/2015 03:27   I have personally reviewed and evaluated these images and lab results as part of my medical decision-making.   EKG Interpretation None      MDM   Final diagnoses:  None    Patient's pain treated here and she feels much better. Will follow with urology is already scheduled    Lacretia Leigh, MD 10/10/15 [phone]

## 2015-10-10 NOTE — ED Notes (Signed)
Made Dr Zenia Resides aware of patient's pain not any better. New verbal orders given.

## 2015-10-10 NOTE — Discharge Instructions (Signed)
Kidney Stones °Kidney stones (urolithiasis) are deposits that form inside your kidneys. The intense pain is caused by the stone moving through the urinary tract. When the stone moves, the ureter goes into spasm around the stone. The stone is usually passed in the urine.  °CAUSES  °· A disorder that makes certain neck glands produce too much parathyroid hormone (primary hyperparathyroidism). °· A buildup of uric acid crystals, similar to gout in your joints. °· Narrowing (stricture) of the ureter. °· A kidney obstruction present at birth (congenital obstruction). °· Previous surgery on the kidney or ureters. °· Numerous kidney infections. °SYMPTOMS  °· Feeling sick to your stomach (nauseous). °· Throwing up (vomiting). °· Blood in the urine (hematuria). °· Pain that usually spreads (radiates) to the groin. °· Frequency or urgency of urination. °DIAGNOSIS  °· Taking a history and physical exam. °· Blood or urine tests. °· CT scan. °· Occasionally, an examination of the inside of the urinary bladder (cystoscopy) is performed. °TREATMENT  °· Observation. °· Increasing your fluid intake. °· Extracorporeal shock wave lithotripsy--This is a noninvasive procedure that uses shock waves to break up kidney stones. °· Surgery may be needed if you have severe pain or persistent obstruction. There are various surgical procedures. Most of the procedures are performed with the use of small instruments. Only small incisions are needed to accommodate these instruments, so recovery time is minimized. °The size, location, and chemical composition are all important variables that will determine the proper choice of action for you. Talk to your health care provider to better understand your situation so that you will minimize the risk of injury to yourself and your kidney.  °HOME CARE INSTRUCTIONS  °· Drink enough water and fluids to keep your urine clear or pale yellow. This will help you to pass the stone or stone fragments. °· Strain  all urine through the provided strainer. Keep all particulate matter and stones for your health care provider to see. The stone causing the pain may be as small as a grain of salt. It is very important to use the strainer each and every time you pass your urine. The collection of your stone will allow your health care provider to analyze it and verify that a stone has actually passed. The stone analysis will often identify what you can do to reduce the incidence of recurrences. °· Only take over-the-counter or prescription medicines for pain, discomfort, or fever as directed by your health care provider. °· Keep all follow-up visits as told by your health care provider. This is important. °· Get follow-up X-rays if required. The absence of pain does not always mean that the stone has passed. It may have only stopped moving. If the urine remains completely obstructed, it can cause loss of kidney function or even complete destruction of the kidney. It is your responsibility to make sure X-rays and follow-ups are completed. Ultrasounds of the kidney can show blockages and the status of the kidney. Ultrasounds are not associated with any radiation and can be performed easily in a matter of minutes. °· Make changes to your daily diet as told by your health care provider. You may be told to: °¨ Limit the amount of salt that you eat. °¨ Eat 5 or more servings of fruits and vegetables each day. °¨ Limit the amount of meat, poultry, fish, and eggs that you eat. °· Collect a 24-hour urine sample as told by your health care provider. You may need to collect another urine sample every 6-12   months. °SEEK MEDICAL CARE IF: °· You experience pain that is progressive and unresponsive to any pain medicine you have been prescribed. °SEEK IMMEDIATE MEDICAL CARE IF:  °· Pain cannot be controlled with the prescribed medicine. °· You have a fever or shaking chills. °· The severity or intensity of pain increases over 18 hours and is not  relieved by pain medicine. °· You develop a new onset of abdominal pain. °· You feel faint or pass out. °· You are unable to urinate. °  °This information is not intended to replace advice given to you by your health care provider. Make sure you discuss any questions you have with your health care provider. °  °Document Released: 05/18/2005 Document Revised: 02/06/2015 Document Reviewed: 10/19/2012 °Elsevier Interactive Patient Education ©2016 Elsevier Inc. ° °

## 2015-10-10 NOTE — ED Notes (Signed)
20 g removed from IV site to RAC.  Catheter intact.  Site unremarkable.  Gauze dressing applied.

## 2015-10-10 NOTE — ED Notes (Signed)
Patient transported to CT 

## 2015-10-10 NOTE — ED Notes (Signed)
Pt seen yesterday at [facility]. DX Rt side Kidney stone. Here for unresolved pain. Pt has N/V and now "cold sweats"

## 2016-07-31 ENCOUNTER — Ambulatory Visit (INDEPENDENT_AMBULATORY_CARE_PROVIDER_SITE_OTHER): Payer: Self-pay | Admitting: Urgent Care

## 2016-07-31 VITALS — BP 122/80 | HR 90 | Temp 98.6°F | Resp 16 | Ht 64.0 in | Wt 152.0 lb

## 2016-07-31 DIAGNOSIS — R52 Pain, unspecified: Secondary | ICD-10-CM

## 2016-07-31 DIAGNOSIS — J029 Acute pharyngitis, unspecified: Secondary | ICD-10-CM

## 2016-07-31 DIAGNOSIS — R05 Cough: Secondary | ICD-10-CM

## 2016-07-31 DIAGNOSIS — R059 Cough, unspecified: Secondary | ICD-10-CM

## 2016-07-31 DIAGNOSIS — R6889 Other general symptoms and signs: Secondary | ICD-10-CM

## 2016-07-31 LAB — POCT INFLUENZA A/B
INFLUENZA A, POC: NEGATIVE
INFLUENZA B, POC: NEGATIVE

## 2016-07-31 MED ORDER — HYDROCODONE-HOMATROPINE 5-1.5 MG/5ML PO SYRP
5.0000 mL | ORAL_SOLUTION | Freq: Three times a day (TID) | ORAL | 0 refills | Status: DC | PRN
Start: 2016-07-31 — End: 2021-08-29

## 2016-07-31 MED ORDER — BENZONATATE 100 MG PO CAPS: 100.0000 mg | ORAL_CAPSULE | Freq: Three times a day (TID) | ORAL | 0 refills | Status: DC | PRN

## 2016-07-31 NOTE — Patient Instructions (Addendum)
Influenza, Adult Influenza, more commonly known as "the flu," is a viral infection that primarily affects the respiratory tract. The respiratory tract includes organs that help you breathe, such as the lungs, nose, and throat. The flu causes many common cold symptoms, as well as a high fever and body aches. The flu spreads easily from person to person (is contagious). Getting a flu shot (influenza vaccination) every year is the best way to prevent influenza. What are the causes? Influenza is caused by a virus. You can catch the virus by:  Breathing in droplets from an infected person's cough or sneeze.  Touching something that was recently contaminated with the virus and then touching your mouth, nose, or eyes.  What increases the risk? The following factors may make you more likely to get the flu:  Not cleaning your hands frequently with soap and water or alcohol-based hand sanitizer.  Having close contact with many people during cold and flu season.  Touching your mouth, eyes, or nose without washing or sanitizing your hands first.  Not drinking enough fluids or not eating a healthy diet.  Not getting enough sleep or exercise.  Being under a high amount of stress.  Not getting a yearly (annual) flu shot.  You may be at a higher risk of complications from the flu, such as a severe lung infection (pneumonia), if you:  Are over the age of 65.  Are pregnant.  Have a weakened disease-fighting system (immune system). You may have a weakened immune system if you: ? Have HIV or AIDS. ? Are undergoing chemotherapy. ? Aretaking medicines that reduce the activity of (suppress) the immune system.  Have a long-term (chronic) illness, such as heart disease, kidney disease, diabetes, or lung disease.  Have a liver disorder.  Are obese.  Have anemia.  What are the signs or symptoms? Symptoms of this condition typically last 4-10 days and may  include:  Fever.  Chills.  Headache, body aches, or muscle aches.  Sore throat.  Cough.  Runny or congested nose.  Chest discomfort and cough.  Poor appetite.  Weakness or tiredness (fatigue).  Dizziness.  Nausea or vomiting.  How is this diagnosed? This condition may be diagnosed based on your medical history and a physical exam. Your health care provider may do a nose or throat swab test to confirm the diagnosis. How is this treated? If influenza is detected early, you can be treated with antiviral medicine that can reduce the length of your illness and the severity of your symptoms. This medicine may be given by mouth (orally) or through an IV tube that is inserted in one of your veins. The goal of treatment is to relieve symptoms by taking care of yourself at home. This may include taking over-the-counter medicines, drinking plenty of fluids, and adding humidity to the air in your home. In some cases, influenza goes away on its own. Severe influenza or complications from influenza may be treated in a hospital. Follow these instructions at home:  Take over-the-counter and prescription medicines only as told by your health care provider.  Use a cool mist humidifier to add humidity to the air in your home. This can make breathing easier.  Rest as needed.  Drink enough fluid to keep your urine clear or pale yellow.  Cover your mouth and nose when you cough or sneeze.  Wash your hands with soap and water often, especially after you cough or sneeze. If soap and water are not available, use hand sanitizer.    Stay home from work or school as told by your health care provider. Unless you are visiting your health care provider, try to avoid leaving home until your fever has been gone for 24 hours without the use of medicine.  Keep all follow-up visits as told by your health care provider. This is important. How is this prevented?  Getting an annual flu shot is the best way  to avoid getting the flu. You may get the flu shot in late summer, fall, or winter. Ask your health care provider when you should get your flu shot.  Wash your hands often or use hand sanitizer often.  Avoid contact with people who are sick during cold and flu season.  Eat a healthy diet, drink plenty of fluids, get enough sleep, and exercise regularly. Contact a health care provider if:  You develop new symptoms.  You have: ? Chest pain. ? Diarrhea. ? A fever.  Your cough gets worse.  You produce more mucus.  You feel nauseous or you vomit. Get help right away if:  You develop shortness of breath or difficulty breathing.  Your skin or nails turn a bluish color.  You have severe pain or stiffness in your neck.  You develop a sudden headache or sudden pain in your face or ear.  You cannot stop vomiting. This information is not intended to replace advice given to you by your health care provider. Make sure you discuss any questions you have with your health care provider. Document Released: 05/15/2000 Document Revised: 10/24/2015 Document Reviewed: 03/12/2015 Elsevier Interactive Patient Education  2017 [employer]    IF you received an x-ray today, you will receive an invoice from [facility]. Please contact [facility] at [phone] with questions or concerns regarding your invoice.   IF you received labwork today, you will receive an invoice from [facility]. Please contact [facility] at [phone] with questions or concerns regarding your invoice.   Our billing staff will not be able to assist you with questions regarding bills from these companies.  You will be contacted with the lab results as soon as they are available. The fastest way to get your results is to activate your My Chart account. Instructions are located on the last page of this paperwork. If you have not heard from us regarding the results in 2 weeks, please contact this office.       

## 2016-07-31 NOTE — Progress Notes (Signed)
  MRN: [id number] DOB: [birth year]  Subjective:   Cheryl Delacruz is a 36 y.o. female presenting for chief complaint of Generalized Body Aches (All symptoms began last night ); Cough; and Sore Throat  Reports sudden onset of malaise last night. Started having subjective fever, body aches, sore throat, dry cough, headache, sinus congestion, bilateral ear pressure, mild nausea without vomiting. Has had shob, wheezing and has needed to use her asthma inhaler more frequently the past day. Denies chest pain, abdominal pain, rashes. Smokes 1/2ppd. Has not smoked in the past 2 days. Takes Claritin for allergies. Patient did not take her flu shot this past season. Works in [employer] as a Programme researcher, broadcasting/film/video, has had plenty of sick contacts.  Cheryl Delacruz has a current medication list which includes the following prescription(s): albuterol, clonazepam, cranberry, and loratadine. Also is allergic to corn-containing products; erythromycin; other; and peanuts [peanut oil].  Cheryl Delacruz  has a past medical history of Asthma and Kidney stone. Also denies past surgical history.  Objective:   Vitals: BP 122/80   Pulse 90   Temp 98.6 F (37 C) (Oral)   Resp 16   Ht 5\' 4"  (1.626 m)   Wt 152 lb (68.9 kg)   SpO2 97%   BMI 26.09 kg/m   Physical Exam  Constitutional: She is oriented to person, place, and time. She appears well-developed and well-nourished.  HENT:  TM's intact bilaterally, no effusions or erythema. Nasal turbinates pink and moist, nasal passages patent. Mild bilateral maxillary sinus tenderness. Oropharynx clear, mucous membranes moist, dentition in good repair.  Eyes: Right eye exhibits no discharge. Left eye exhibits no discharge.  Neck: Normal range of motion. Neck supple.  Cardiovascular: Normal rate, regular rhythm and intact distal pulses.  Exam reveals no gallop and no friction rub.   No murmur heard. Pulmonary/Chest: No respiratory distress. She has no wheezes. She has no rales.    Lymphadenopathy:    She has no cervical adenopathy.  Neurological: She is alert and oriented to person, place, and time.  Skin: Skin is warm and dry.   Results for orders placed or performed in visit on 07/31/16 (from the past 24 hour(s))  POCT Influenza A/B     Status: None   Collection Time: 07/31/16  3:14 PM  Result Value Ref Range   Influenza A, POC Negative Negative   Influenza B, POC Negative Negative   Assessment and Plan :   1. Flu-like symptoms 2. Body aches 3. Cough 4. Sore throat - Will manage supportively. Cough suppression offered. Recheck if no improvement in 5-7 days.  Cheryl Eagles, PA-C [facility] [id number] 07/31/2016  2:51 PM

## 2016-08-01 ENCOUNTER — Telehealth: Payer: Self-pay | Admitting: Urgent Care

## 2016-08-01 NOTE — Telephone Encounter (Signed)
I informed  pt she was negative for POCT Influenza A/B per Santiago Glad.

## 2016-08-30 ENCOUNTER — Encounter (HOSPITAL_COMMUNITY): Payer: Self-pay

## 2016-08-30 ENCOUNTER — Emergency Department (HOSPITAL_COMMUNITY): Payer: Self-pay

## 2016-08-30 ENCOUNTER — Emergency Department (HOSPITAL_COMMUNITY)
Admission: EM | Admit: 2016-08-30 | Discharge: 2016-08-30 | Disposition: A | Discharge status: Home / Self Care | Payer: Self-pay | Attending: Emergency Medicine | Admitting: Emergency Medicine

## 2016-08-30 DIAGNOSIS — F17210 Nicotine dependence, cigarettes, uncomplicated: Secondary | ICD-10-CM | POA: Insufficient documentation

## 2016-08-30 DIAGNOSIS — Z91010 Allergy to peanuts: Secondary | ICD-10-CM | POA: Insufficient documentation

## 2016-08-30 DIAGNOSIS — J4521 Mild intermittent asthma with (acute) exacerbation: Secondary | ICD-10-CM | POA: Insufficient documentation

## 2016-08-30 MED ORDER — PREDNISONE 20 MG PO TABS: 40.0000 mg | ORAL_TABLET | Freq: Every day | ORAL | 0 refills | Status: DC

## 2016-08-30 MED ORDER — PREDNISONE 20 MG PO TABS
60.0000 mg | ORAL_TABLET | Freq: Once | ORAL | Status: AC
  Administered 2016-08-30: 60 mg via ORAL
  Filled 2016-08-30: qty 3

## 2016-08-30 MED ORDER — IPRATROPIUM BROMIDE 0.02 % IN SOLN
0.5000 mg | Freq: Once | RESPIRATORY_TRACT | Status: AC
  Administered 2016-08-30: 0.5 mg via RESPIRATORY_TRACT
  Filled 2016-08-30: qty 2.5

## 2016-08-30 MED ORDER — IPRATROPIUM-ALBUTEROL 0.5-2.5 (3) MG/3ML IN SOLN
3.0000 mL | Freq: Once | RESPIRATORY_TRACT | Status: AC
  Administered 2016-08-30: 3 mL via RESPIRATORY_TRACT
  Filled 2016-08-30: qty 3

## 2016-08-30 MED ORDER — ALBUTEROL SULFATE (2.5 MG/3ML) 0.083% IN NEBU
5.0000 mg | INHALATION_SOLUTION | Freq: Once | RESPIRATORY_TRACT | Status: AC
  Administered 2016-08-30: 5 mg via RESPIRATORY_TRACT
  Filled 2016-08-30: qty 6

## 2016-08-30 MED ORDER — ALBUTEROL SULFATE HFA 108 (90 BASE) MCG/ACT IN AERS: 1.0000 | INHALATION_SPRAY | Freq: Four times a day (QID) | RESPIRATORY_TRACT | 0 refills | Status: AC | PRN

## 2016-08-30 NOTE — ED Triage Notes (Signed)
Patient c/o productive cough with clear sputum since yesterday. Patient used her albuterol ihaler this AM with no relief. Patient denies any fever.

## 2016-08-30 NOTE — ED Provider Notes (Signed)
[facility] DEPT Provider Note   CSN: [id number] Arrival date & time: 08/30/16  5188     History   Chief Complaint Chief Complaint  Patient presents with  . Shortness of Breath  . Wheezing    HPI Cheryl Delacruz is a 36 y.o. female.  Cheryl Delacruz is a 36 y.o. Female with a history of asthma who presents to the emergency department complaining of an asthma attack. Patient reports she has been having wheezing and chest tightness since yesterday. She reports an associated cough. She denies any fevers. She has a history of asthma and has an albuterol inhaler at home. She reports using it one time today before coming to the emergency department. No other treatments attempted prior to arrival today. She denies fevers, hemoptysis, leg pain, leg swelling, lightheadedness, syncope, chest pain, or rashes.   The history is provided by the patient, medical records and the spouse. No language interpreter was used.  Shortness of Breath  Associated symptoms include cough and wheezing. Pertinent negatives include no fever, no headaches, no sore throat, no neck pain, no chest pain, no vomiting, no abdominal pain, no rash and no leg swelling.  Wheezing   Associated symptoms include cough. Pertinent negatives include no chest pain, no fever, no abdominal pain, no vomiting, no dysuria, no headaches, no sore throat, no neck pain and no rash.    Past Medical History:  Diagnosis Date  . Asthma   . Kidney stone     Patient Active Problem List   Diagnosis Date Noted  . 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 No pertinent surgical history.  OB History    No data available       Home Medications    Prior to Admission medications   Medication Sig Start Date End Date Taking? Authorizing Provider  benzonatate (TESSALON) 100 MG capsule Take 1-2 capsules (100-200 mg total) by mouth 3 (three) times daily as needed. Patient taking differently: Take 100-200 mg by mouth 3 (three)  times daily as needed for cough.  07/31/16  Yes Jaynee Eagles, PA-C  clonazePAM (KLONOPIN) 0.5 MG tablet Take 0.5 mg by mouth 2 (two) times daily as needed for anxiety.  03/25/15  Yes Historical Provider, MD  CRANBERRY PO Take 2 tablets by mouth once.   Yes Historical Provider, MD  loratadine (CLARITIN) 10 MG tablet Take 10 mg by mouth daily.   Yes Historical Provider, MD  albuterol (PROVENTIL HFA;VENTOLIN HFA) 108 (90 Base) MCG/ACT inhaler Inhale 1-2 puffs into the lungs every 6 (six) hours as needed for wheezing or shortness of breath. 08/30/16   Waynetta Pean, PA-C  HYDROcodone-homatropine (HYCODAN) 5-1.5 MG/5ML syrup Take 5 mLs by mouth every 8 (eight) hours as needed for cough. Patient not taking: Reported on 08/30/2016 07/31/16   Jaynee Eagles, PA-C  predniSONE (DELTASONE) 20 MG tablet Take 2 tablets (40 mg total) by mouth daily. 08/30/16   Waynetta Pean, PA-C    Family History Family History  Problem Relation Age of Onset  . Diabetes Mother   . Heart disease Mother   . Hyperlipidemia Father   . Diabetes Maternal Grandmother   . Heart disease Maternal Grandmother   . Diabetes Paternal Grandfather   . Heart disease Paternal Grandfather   . Hyperlipidemia Paternal Grandfather     Social History Social History  Substance Use Topics  . Smoking status: Current Every Day Smoker    Packs/day: 0.50    Types: Cigarettes  . Smokeless tobacco: Never Used  .  Alcohol use Yes     Comment: occasionally     Allergies   Corn-containing products; Erythromycin; Other; and Peanuts [peanut oil]   Review of Systems Review of Systems  Constitutional: Negative for chills and fever.  HENT: Negative for congestion and sore throat.   Eyes: Negative for visual disturbance.  Respiratory: Positive for cough, chest tightness, shortness of breath and wheezing.   Cardiovascular: Negative for chest pain and leg swelling.  Gastrointestinal: Negative for abdominal pain, nausea and vomiting.  Genitourinary:  Negative for dysuria.  Musculoskeletal: Negative for back pain and neck pain.  Skin: Negative for rash.  Neurological: Negative for dizziness, syncope, light-headedness and headaches.     Physical Exam Updated Vital Signs BP 132/88 (BP Location: Left Arm)   Pulse (!) 102   Temp 97.7 F (36.5 C) (Oral)   Resp 16   Ht 5\' 4"  (1.626 m)   Wt 68 kg   LMP 08/10/2016   SpO2 100%   BMI 25.75 kg/m   Physical Exam  Constitutional: She appears well-developed and well-nourished. No distress.  HENT:  Head: Normocephalic and atraumatic.  Mouth/Throat: Oropharynx is clear and moist.  Eyes: Conjunctivae are normal. Pupils are equal, round, and reactive to light. Right eye exhibits no discharge. Left eye exhibits no discharge.  Neck: Neck supple.  Cardiovascular: Normal rate, regular rhythm, normal heart sounds and intact distal pulses.  Exam reveals no gallop and no friction rub.   No murmur heard. Pulmonary/Chest: No respiratory distress. She has wheezes. She has no rales.  Wheezing noted diffusely. Inspiratory and expiratory wheezes noted. Patient speaking in complete sentences. Respirations are 22. Oxygen saturation is 99% on room air.  Abdominal: Soft. There is no tenderness.  Musculoskeletal: She exhibits no edema.  Lymphadenopathy:    She has no cervical adenopathy.  Neurological: She is alert. Coordination normal.  Skin: Skin is warm and dry. No rash noted. She is not diaphoretic. No erythema. No pallor.  Psychiatric: She has a normal mood and affect. Her behavior is normal.  Nursing note and vitals reviewed.    ED Treatments / Results  Labs (all labs ordered are listed, but only abnormal results are displayed) Labs Reviewed - No data to display  EKG  EKG Interpretation None       Radiology No results found.  Procedures Procedures (including critical care time)  Medications Ordered in ED Medications  albuterol (PROVENTIL) (2.5 MG/3ML) 0.083% nebulizer solution 5  mg (5 mg Nebulization Given 08/30/16 0844)  ipratropium (ATROVENT) nebulizer solution 0.5 mg (0.5 mg Nebulization Given 08/30/16 0853)  predniSONE (DELTASONE) tablet 60 mg (60 mg Oral Given 08/30/16 0853)  ipratropium-albuterol (DUONEB) 0.5-2.5 (3) MG/3ML nebulizer solution 3 mL (3 mLs Nebulization Given 08/30/16 0951)     Initial Impression / Assessment and Plan / ED Course  I have reviewed the triage vital signs and the nursing notes.  Pertinent labs & imaging results that were available during my care of the patient were reviewed by me and considered in my medical decision making (see chart for details).    This  is a 36 y.o. Female with a history of asthma who presents to the emergency department complaining of an asthma attack. Patient reports she has been having wheezing and chest tightness since yesterday. She reports an associated cough. She denies any fevers. She has a history of asthma and has an albuterol inhaler at home. She reports using it one time today before coming to the emergency department. On exam the patient  is afebrile nontoxic appearing. She has wheezing noted bilaterally. Oxygen saturation is 99% on room air. At reevaluation after breathing treatment patient reports feeling much better. Repeat lung exam has greatly improved. No wheezing noted. Patient still reports some chest tightness. Will provide with a DuoNeb and reevaluate. After DuoNeb patient's lung sounds are clear. No wheezing. Patient reports her chest tightness has resolved. She has no shortness of breath. No wheezing. She feels ready for discharge. We'll discharge with a course of prednisone and albuterol. I discussed strict and specific return precautions. I advised the patient to follow-up with their primary care provider this week. I advised the patient to return to the emergency department with new or worsening symptoms or new concerns. The patient verbalized understanding and agreement with plan.      Final  Clinical Impressions(s) / ED Diagnoses   Final diagnoses:  Mild intermittent asthma with exacerbation    New Prescriptions New Prescriptions   ALBUTEROL (PROVENTIL HFA;VENTOLIN HFA) 108 (90 BASE) MCG/ACT INHALER    Inhale 1-2 puffs into the lungs every 6 (six) hours as needed for wheezing or shortness of breath.   PREDNISONE (DELTASONE) 20 MG TABLET    Take 2 tablets (40 mg total) by mouth daily.     Waynetta Pean, PA-C 08/30/16 Experiment, MD 09/01/16 [phone]

## 2017-05-27 ENCOUNTER — Emergency Department (HOSPITAL_COMMUNITY): Payer: Self-pay

## 2017-05-27 ENCOUNTER — Encounter (HOSPITAL_COMMUNITY): Payer: Self-pay | Admitting: Emergency Medicine

## 2017-05-27 ENCOUNTER — Other Ambulatory Visit: Payer: Self-pay

## 2017-05-27 ENCOUNTER — Emergency Department (HOSPITAL_COMMUNITY)
Admission: EM | Admit: 2017-05-27 | Discharge: 2017-05-28 | Disposition: A | Discharge status: Home / Self Care | Payer: Self-pay | Attending: Emergency Medicine | Admitting: Emergency Medicine

## 2017-05-27 DIAGNOSIS — R0981 Nasal congestion: Secondary | ICD-10-CM | POA: Insufficient documentation

## 2017-05-27 DIAGNOSIS — F17210 Nicotine dependence, cigarettes, uncomplicated: Secondary | ICD-10-CM | POA: Insufficient documentation

## 2017-05-27 DIAGNOSIS — J45909 Unspecified asthma, uncomplicated: Secondary | ICD-10-CM | POA: Insufficient documentation

## 2017-05-27 DIAGNOSIS — Z79899 Other long term (current) drug therapy: Secondary | ICD-10-CM | POA: Insufficient documentation

## 2017-05-27 DIAGNOSIS — Z91010 Allergy to peanuts: Secondary | ICD-10-CM | POA: Insufficient documentation

## 2017-05-27 DIAGNOSIS — R05 Cough: Secondary | ICD-10-CM | POA: Insufficient documentation

## 2017-05-27 DIAGNOSIS — J4521 Mild intermittent asthma with (acute) exacerbation: Secondary | ICD-10-CM | POA: Insufficient documentation

## 2017-05-27 MED ORDER — PREDNISONE 20 MG PO TABS
60.0000 mg | ORAL_TABLET | Freq: Once | ORAL | Status: AC
  Administered 2017-05-28: 60 mg via ORAL
  Filled 2017-05-27: qty 3

## 2017-05-27 MED ORDER — ALBUTEROL SULFATE (2.5 MG/3ML) 0.083% IN NEBU
5.0000 mg | INHALATION_SOLUTION | Freq: Once | RESPIRATORY_TRACT | Status: AC
  Administered 2017-05-27: 5 mg via RESPIRATORY_TRACT
  Filled 2017-05-27: qty 6

## 2017-05-27 MED ORDER — PREDNISONE 20 MG PO TABS: 40.0000 mg | ORAL_TABLET | Freq: Every day | ORAL | 0 refills | Status: AC

## 2017-05-27 MED ORDER — ALBUTEROL SULFATE HFA 108 (90 BASE) MCG/ACT IN AERS
1.0000 | INHALATION_SPRAY | Freq: Once | RESPIRATORY_TRACT | Status: AC
  Administered 2017-05-28: 2 via RESPIRATORY_TRACT
  Filled 2017-05-27: qty 6.7

## 2017-05-27 MED ORDER — METHYLPREDNISOLONE SODIUM SUCC 125 MG IJ SOLR: 80.0000 mg | Freq: Once | INTRAMUSCULAR | Status: DC

## 2017-05-27 NOTE — Discharge Instructions (Signed)
Please read instructions below. Use your albuterol inhaler every 6 hours as needed for SOB or wheezing. Begin taking the prednisone, starting tomorrow, daily until gone.  You can take mucinex as needed for congestion. Drink plenty of water. Tylenol as needed for sore throat. Follow up with your primary care provider. Return to the ER for shortness of breath not improved with albuterol, fever, or new or concerning symptoms.

## 2017-05-27 NOTE — ED Provider Notes (Signed)
[facility] DEPT Provider Note   CSN: [id number] Arrival date & time: 05/27/17  1949     History   Chief Complaint Chief Complaint  Patient presents with  . Asthma    HPI Cheryl Delacruz is a 36 y.o. female patient with past medical history of asthma, presenting to the ED with wheezing and shortness of breath since last night.  Patient states she ran out of her butyryl last night, and wheezing with cough and some shortness of breath throughout the day.  She states yesterday she developed some nasal congestion and thinks she may be coming down with a cold.  Denies fever, sore throat, ear pain, or any other complaints.  States the albuterol given to her in triage provided her with significant relief of symptoms and she feels she is breathing at her baseline.  The history is provided by the patient.    Past Medical History:  Diagnosis Date  . Asthma   . Kidney stone     Patient Active Problem List   Diagnosis Date Noted  . 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 No pertinent surgical history.  OB History    No data available       Home Medications    Prior to Admission medications   Medication Sig Start Date End Date Taking? Authorizing Provider  albuterol (PROVENTIL HFA;VENTOLIN HFA) 108 (90 Base) MCG/ACT inhaler Inhale 1-2 puffs into the lungs every 6 (six) hours as needed for wheezing or shortness of breath. 08/30/16   Waynetta Pean, PA-C  benzonatate (TESSALON) 100 MG capsule Take 1-2 capsules (100-200 mg total) by mouth 3 (three) times daily as needed. Patient taking differently: Take 100-200 mg by mouth 3 (three) times daily as needed for cough.  07/31/16   Jaynee Eagles, PA-C  clonazePAM (KLONOPIN) 0.5 MG tablet Take 0.5 mg by mouth 2 (two) times daily as needed for anxiety.  03/25/15   [provider]  CRANBERRY PO Take 2 tablets by mouth once.    [provider]  HYDROcodone-homatropine (HYCODAN) 5-1.5  MG/5ML syrup Take 5 mLs by mouth every 8 (eight) hours as needed for cough. Patient not taking: Reported on 08/30/2016 07/31/16   Jaynee Eagles, PA-C  loratadine (CLARITIN) 10 MG tablet Take 10 mg by mouth daily.    [provider]  predniSONE (DELTASONE) 20 MG tablet Take 2 tablets (40 mg total) by mouth daily for 5 days. 05/27/17 06/01/17  Robinson, [place] N, PA-C    Family History Family History  Problem Relation Age of Onset  . Diabetes Mother   . Heart disease Mother   . Hyperlipidemia Father   . Diabetes Maternal Grandmother   . Heart disease Maternal Grandmother   . Diabetes Paternal Grandfather   . Heart disease Paternal Grandfather   . Hyperlipidemia Paternal Grandfather     Social History Social History   Tobacco Use  . Smoking status: Current Every Day Smoker    Packs/day: 0.50    Types: Cigarettes  . Smokeless tobacco: Never Used  Substance Use Topics  . Alcohol use: Yes    Comment: occasionally  . Drug use: No     Allergies   Corn-containing products; Erythromycin; Other; and Peanuts [peanut oil]   Review of Systems Review of Systems  Constitutional: Negative for fever.  HENT: Positive for congestion. Negative for ear pain and sore throat.   Respiratory: Positive for cough, shortness of breath and wheezing.   All other systems reviewed and are  negative.    Physical Exam Updated Vital Signs BP 132/90 (BP Location: Left Arm)   Pulse (!) 109   Temp 98.4 F (36.9 C) (Oral)   Resp 12   Ht 5\' 5"  (1.651 m)   Wt 65.8 kg (145 lb)   LMP 05/20/2017   SpO2 98%   BMI 24.13 kg/m   Physical Exam  Constitutional: She appears well-developed and well-nourished. No distress.  HENT:  Head: Normocephalic and atraumatic.  Mouth/Throat: Oropharynx is clear and moist.  Eyes: Conjunctivae and EOM are normal. Pupils are equal, round, and reactive to light.  Neck: Normal range of motion. Neck supple. No tracheal deviation present.  Cardiovascular: Regular  rhythm, normal heart sounds and intact distal pulses.  Pulmonary/Chest: Effort normal and breath sounds normal. No stridor. No respiratory distress. She has no wheezes. She has no rales.  No inc work of breathing, resting comfortably.  Abdominal: Soft. Bowel sounds are normal.  Lymphadenopathy:    She has no cervical adenopathy.  Neurological: She is alert.  Skin: Skin is warm.  Psychiatric: She has a normal mood and affect. Her behavior is normal.  Nursing note and vitals reviewed.    ED Treatments / Results  Labs (all labs ordered are listed, but only abnormal results are displayed) Labs Reviewed - No data to display  EKG  EKG Interpretation None       Radiology Dg Chest 2 View  Result Date: 05/27/2017 CLINICAL DATA:  Acute onset of shortness of breath, central chest pain and dry cough. EXAM: CHEST  2 VIEW COMPARISON:  Chest radiograph performed 06/11/2014 FINDINGS: The lungs are well-aerated. Mild peribronchial thickening is noted. There is no evidence of focal opacification, pleural effusion or pneumothorax. The heart is normal in size; the mediastinal contour is within normal limits. No acute osseous abnormalities are seen. IMPRESSION: Mild peribronchial thickening noted.  Lungs otherwise grossly clear. Electronically Signed   By: Garald Balding M.D.   On: 05/27/2017 21:31    Procedures Procedures (including critical care time)  Medications Ordered in ED Medications  albuterol (PROVENTIL) (2.5 MG/3ML) 0.083% nebulizer solution 5 mg (5 mg Nebulization Given 05/27/17 2058)  albuterol (PROVENTIL HFA;VENTOLIN HFA) 108 (90 Base) MCG/ACT inhaler 1-2 puff (2 puffs Inhalation Given 05/28/17 0002)  predniSONE (DELTASONE) tablet 60 mg (60 mg Oral Given 05/28/17 0001)     Initial Impression / Assessment and Plan / ED Course  I have reviewed the triage vital signs and the nursing notes.  Pertinent labs & imaging results that were available during my care of the patient were  reviewed by me and considered in my medical decision making (see chart for details).     Patient w asthma exacerbation, out of albuterol at home. Also reporting new cold-like symptoms. Albuterol neb given in triage, which patient reports provide her with significant relief of symptoms and is breathing at her baseline.  On my evaluation, lungs clear to auscultation bilaterally, no wheezes, no increased work of breathing.  ENT exam benign. Afebrile, tolerating secretions.  Chest x-ray negative for pneumonia. Prednisone given in the ED and pt will bd dc with 5 day burst.  Will also send with albuterol inhaler.  Pt states they are breathing at baseline. Pt has been instructed to continue using prescribed medications and to speak with PCP about today's exacerbation.   Discussed results, findings, treatment and follow up. Patient advised of return precautions. Patient verbalized understanding and agreed with plan.  Final Clinical Impressions(s) / ED Diagnoses   Final  diagnoses:  Mild intermittent asthma with exacerbation    ED Discharge Orders        Ordered    predniSONE (DELTASONE) 20 MG tablet  Daily     05/27/17 2346       Robinson, [place] N, [place] 05/28/17 0004    Little, Wenda Overland, MD 05/31/17 [id number]

## 2017-05-27 NOTE — ED Triage Notes (Signed)
Pt reports having wheezing and shortness of breath since last night. Pt reports hx of asthma and ran out of medications. Pt has active wheezing at time of triage.

## 2019-03-21 ENCOUNTER — Other Ambulatory Visit: Payer: Self-pay

## 2019-03-21 DIAGNOSIS — Z20822 Contact with and (suspected) exposure to covid-19: Secondary | ICD-10-CM

## 2019-03-22 LAB — NOVEL CORONAVIRUS, NAA: SARS-CoV-2, NAA: NOT DETECTED

## 2019-04-05 ENCOUNTER — Other Ambulatory Visit: Payer: Self-pay

## 2019-04-05 DIAGNOSIS — Z20822 Contact with and (suspected) exposure to covid-19: Secondary | ICD-10-CM

## 2019-04-06 LAB — NOVEL CORONAVIRUS, NAA: SARS-CoV-2, NAA: NOT DETECTED

## 2019-04-12 ENCOUNTER — Other Ambulatory Visit: Payer: Self-pay

## 2019-04-12 DIAGNOSIS — Z20822 Contact with and (suspected) exposure to covid-19: Secondary | ICD-10-CM

## 2019-04-13 IMAGING — CR DG CHEST 2V
2 series · 2 of 2 positions shown · non-contrast
Comparison: Chest radiograph performed 06/11/2014

CLINICAL DATA: Acute onset of shortness of breath, central chest
pain and dry cough.

EXAM:
CHEST  2 VIEW

[w chest pa]
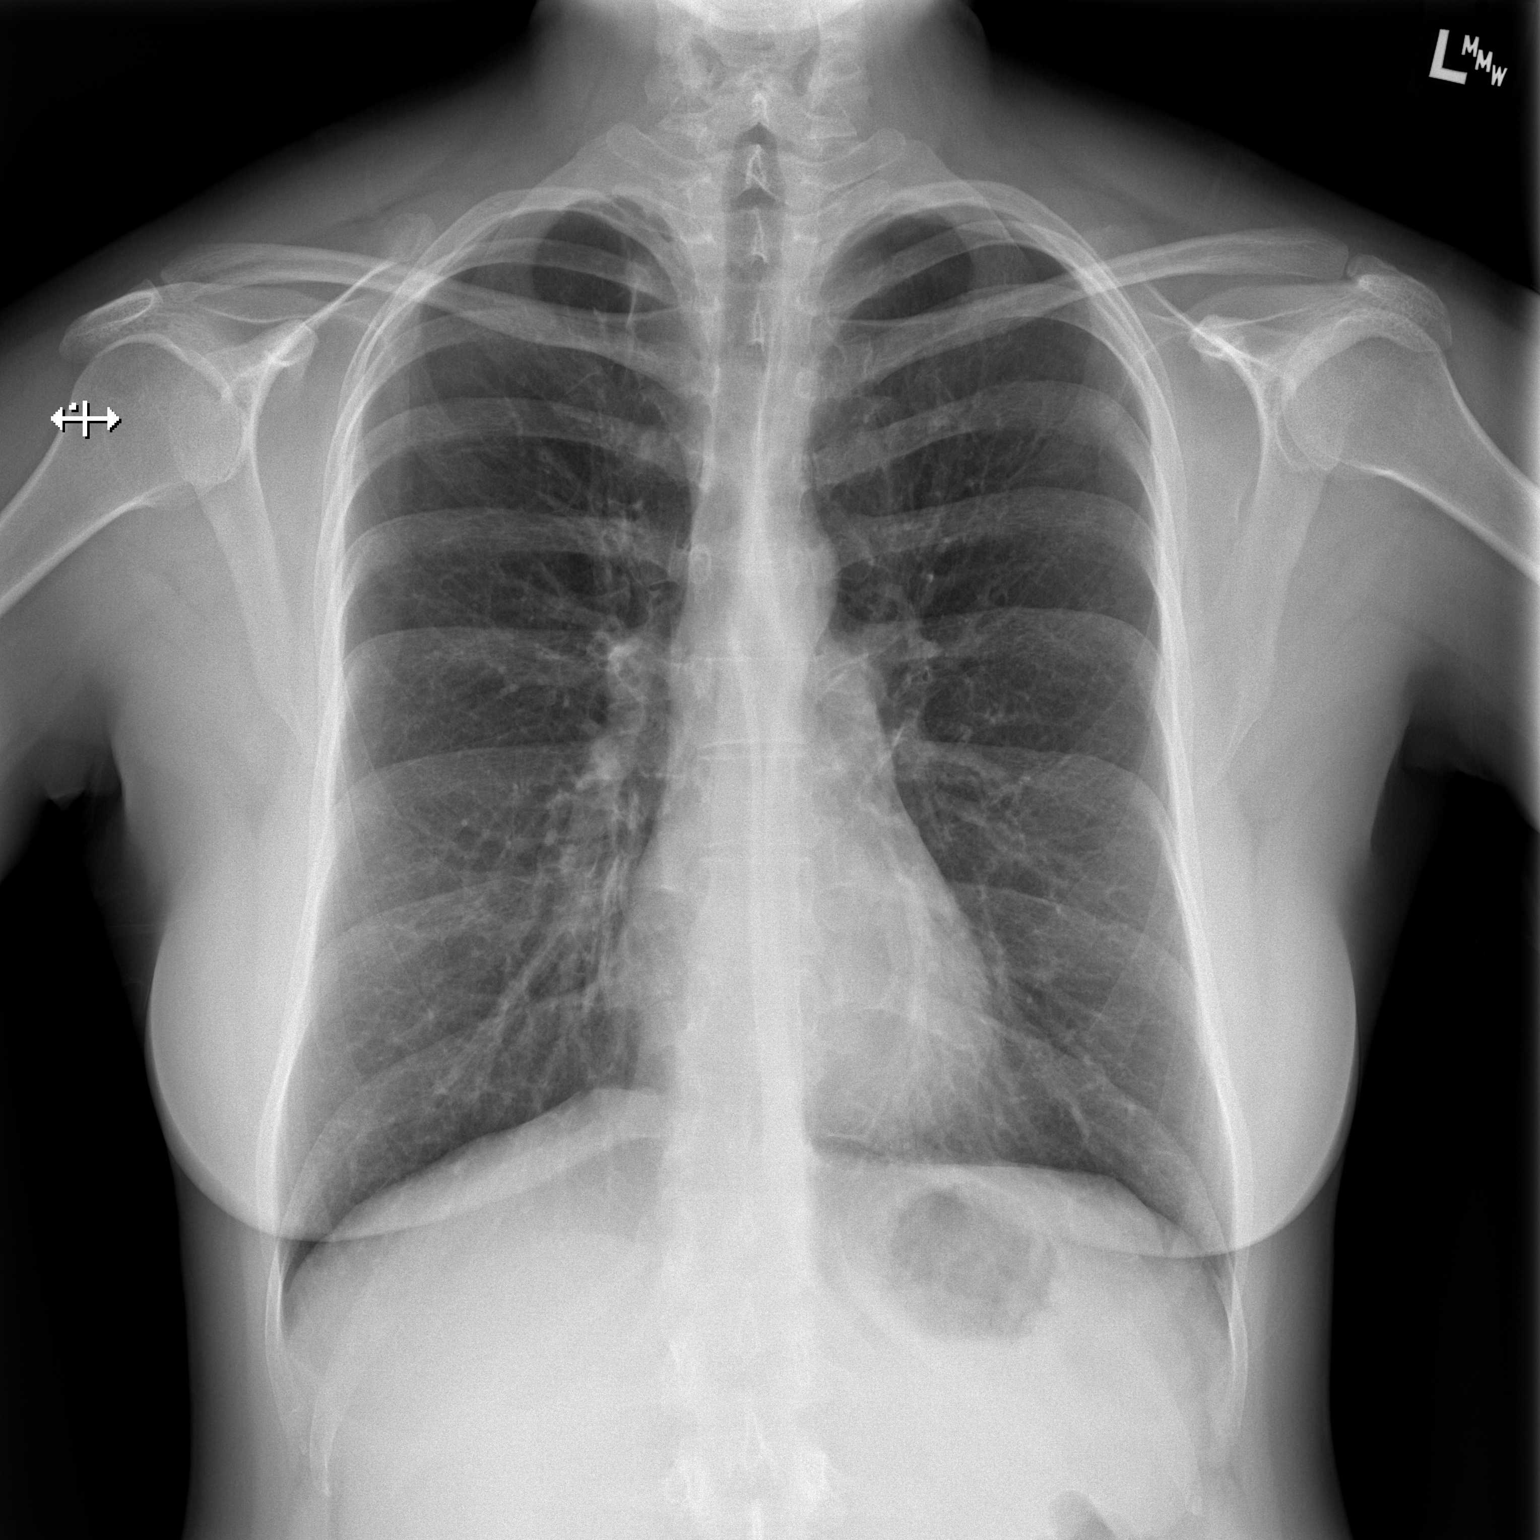

[w chest lat]
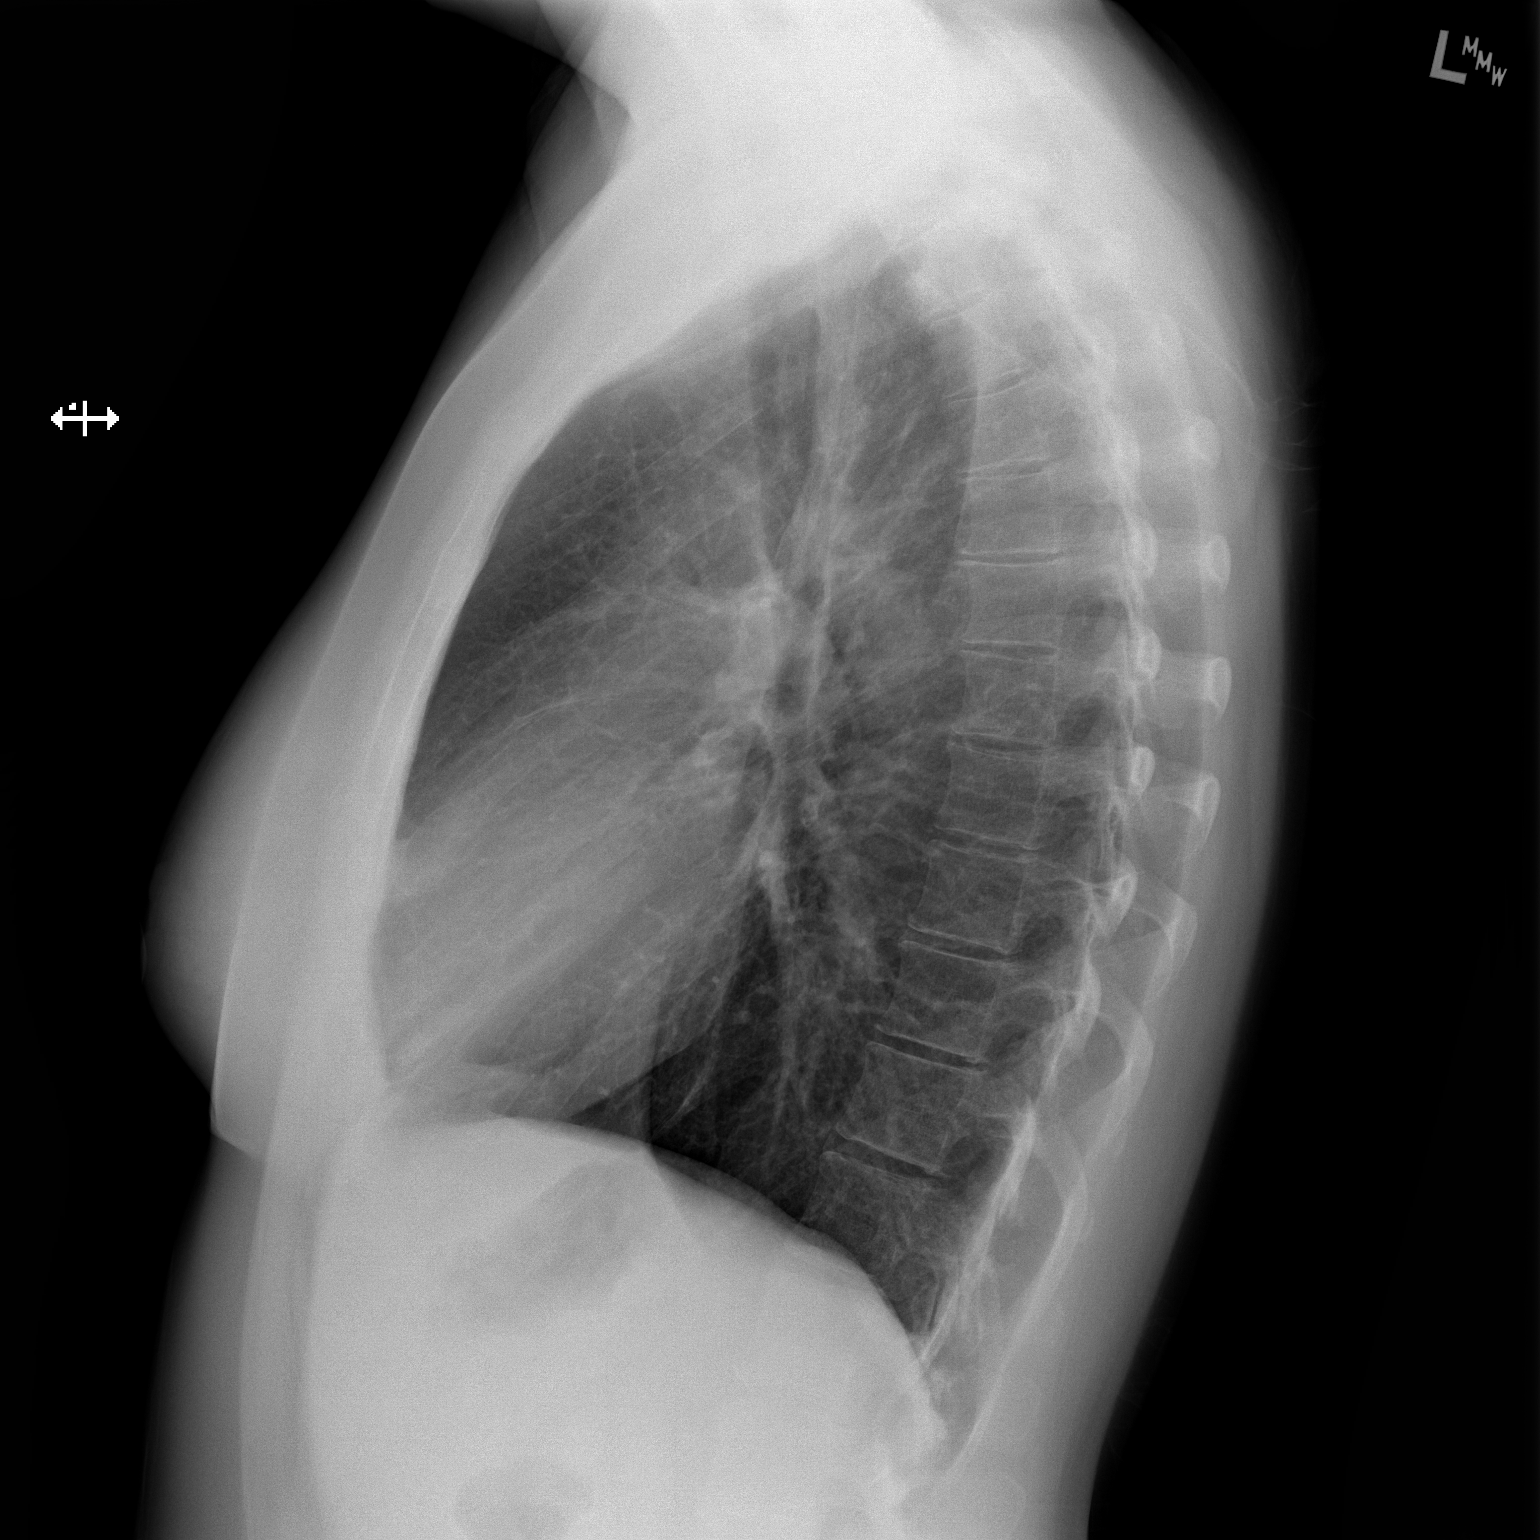

[2 of 2 positions shown; findings below may reference images not displayed]

FINDINGS: The lungs are well-aerated. Mild peribronchial thickening is noted.
There is no evidence of focal opacification, pleural effusion or
pneumothorax.

The heart is normal in size; the mediastinal contour is within
normal limits. No acute osseous abnormalities are seen.
IMPRESSION: Mild peribronchial thickening noted.  Lungs otherwise grossly clear.

## 2019-04-14 LAB — NOVEL CORONAVIRUS, NAA: SARS-CoV-2, NAA: NOT DETECTED

## 2019-07-03 ENCOUNTER — Ambulatory Visit: Payer: Self-pay | Attending: Internal Medicine

## 2019-07-03 DIAGNOSIS — U071 COVID-19: Secondary | ICD-10-CM | POA: Insufficient documentation

## 2019-07-03 DIAGNOSIS — Z20822 Contact with and (suspected) exposure to covid-19: Secondary | ICD-10-CM

## 2019-07-04 LAB — NOVEL CORONAVIRUS, NAA: SARS-CoV-2, NAA: DETECTED — AB

## 2021-08-29 ENCOUNTER — Emergency Department (HOSPITAL_COMMUNITY): Payer: Self-pay

## 2021-08-29 ENCOUNTER — Encounter (HOSPITAL_COMMUNITY): Payer: Self-pay | Admitting: Emergency Medicine

## 2021-08-29 ENCOUNTER — Emergency Department (HOSPITAL_COMMUNITY)
Admission: EM | Admit: 2021-08-29 | Discharge: 2021-08-29 | Disposition: A | Discharge status: Home / Self Care | Payer: Self-pay | Attending: Emergency Medicine | Admitting: Emergency Medicine

## 2021-08-29 ENCOUNTER — Other Ambulatory Visit: Payer: Self-pay

## 2021-08-29 DIAGNOSIS — D259 Leiomyoma of uterus, unspecified: Secondary | ICD-10-CM | POA: Insufficient documentation

## 2021-08-29 DIAGNOSIS — Z91010 Allergy to peanuts: Secondary | ICD-10-CM | POA: Insufficient documentation

## 2021-08-29 DIAGNOSIS — Z79899 Other long term (current) drug therapy: Secondary | ICD-10-CM | POA: Insufficient documentation

## 2021-08-29 LAB — COMPREHENSIVE METABOLIC PANEL
ALT: 16 U/L (ref 0–44)
AST: 19 U/L (ref 15–41)
Albumin: 4.3 g/dL (ref 3.5–5.0)
Alkaline Phosphatase: 53 U/L (ref 38–126)
Anion gap: 6 (ref 5–15)
BUN: 14 mg/dL (ref 6–20)
CO2: 22 mmol/L (ref 22–32)
Calcium: 9.1 mg/dL (ref 8.9–10.3)
Chloride: 108 mmol/L (ref 98–111)
Creatinine, Ser: 0.79 mg/dL (ref 0.44–1.00)
GFR, Estimated: >60 mL/min (ref >60)
Glucose, Bld: 94 mg/dL (ref 70–99)
Potassium: 3.1 mmol/L — ABNORMAL LOW (ref 3.5–5.1)
Sodium: 136 mmol/L (ref 135–145)
Total Bilirubin: 0.2 mg/dL — ABNORMAL LOW (ref 0.3–1.2)
Total Protein: 6.9 g/dL (ref 6.5–8.1)

## 2021-08-29 LAB — URINALYSIS, ROUTINE W REFLEX MICROSCOPIC
Bilirubin Urine: NEGATIVE
Glucose, UA: NEGATIVE mg/dL
Ketones, ur: NEGATIVE mg/dL
Leukocytes,Ua: NEGATIVE
Nitrite: NEGATIVE
Protein, ur: NEGATIVE mg/dL
Specific Gravity, Urine: 1.026 (ref 1.005–1.030)
pH: 5 (ref 5.0–8.0)

## 2021-08-29 LAB — CBC
HCT: 34.9 % — ABNORMAL LOW (ref 36.0–46.0)
Hemoglobin: 11.4 g/dL — ABNORMAL LOW (ref 12.0–15.0)
MCH: 28 pg (ref 26.0–34.0)
MCHC: 32.7 g/dL (ref 30.0–36.0)
MCV: 85.7 fL (ref 80.0–100.0)
Platelets: 498 10*3/uL — ABNORMAL HIGH (ref 150–400)
RBC: 4.07 MIL/uL (ref 3.87–5.11)
RDW: 14.6 % (ref 11.5–15.5)
WBC: 14.1 10*3/uL — ABNORMAL HIGH (ref 4.0–10.5)
nRBC: 0 % (ref 0.0–0.2)

## 2021-08-29 LAB — LIPASE, BLOOD: Lipase: 34 U/L (ref 11–51)

## 2021-08-29 LAB — I-STAT BETA HCG BLOOD, ED (MC, WL, AP ONLY): I-stat hCG, quantitative: <5 m[IU]/mL (ref <5)

## 2021-08-29 MED ORDER — IOHEXOL 300 MG/ML  SOLN
100.0000 mL | Freq: Once | INTRAMUSCULAR | Status: AC | PRN
  Administered 2021-08-29: 100 mL via INTRAVENOUS

## 2021-08-29 MED ORDER — ONDANSETRON HCL 4 MG/2ML IJ SOLN: 4.0000 mg | Freq: Once | INTRAMUSCULAR | Status: AC

## 2021-08-29 MED ORDER — ONDANSETRON HCL 4 MG/2ML IJ SOLN
INTRAMUSCULAR | Status: AC
  Administered 2021-08-29: 4 mg via INTRAVENOUS
  Filled 2021-08-29: qty 2

## 2021-08-29 MED ORDER — SODIUM CHLORIDE (PF) 0.9 % IJ SOLN
INTRAMUSCULAR | Status: AC
  Filled 2021-08-29: qty 50

## 2021-08-29 MED ORDER — ONDANSETRON HCL 4 MG/2ML IJ SOLN: 4.0000 mg | Freq: Once | INTRAMUSCULAR | Status: DC

## 2021-08-29 MED ORDER — FENTANYL CITRATE PF 50 MCG/ML IJ SOSY
100.0000 ug | PREFILLED_SYRINGE | Freq: Once | INTRAMUSCULAR | Status: AC
  Administered 2021-08-29: 100 ug via INTRAVENOUS
  Filled 2021-08-29: qty 2

## 2021-08-29 MED ORDER — HYDROCODONE-ACETAMINOPHEN 5-325 MG PO TABS: 1.0000 | ORAL_TABLET | Freq: Four times a day (QID) | ORAL | 0 refills | Status: DC | PRN

## 2021-08-29 NOTE — ED Triage Notes (Signed)
Patient is complaining of a lower right abdominal pain. This all started on 3/30 in early morning. Patient is feeling nauseas. ?

## 2021-08-29 NOTE — ED Provider Notes (Signed)
?[facility] DEPT ?Provider Note ? ? ?CSN: [id number] ?Arrival date & time: 08/29/21  0343 ? ?  ? ?History ? ?Chief Complaint  ?Patient presents with  ? Abdominal Pain  ? ? ?Cheryl Delacruz is a 41 y.o. female. ? ?The history is provided by the patient.  ?Abdominal Pain ?Pain location:  RLQ ?Pain quality: aching   ?Pain radiates to:  Does not radiate ?Pain severity:  Moderate ?Onset quality:  Gradual ?Duration:  24 hours ?Timing:  Constant ?Progression:  Worsening ?Chronicity:  New ?Relieved by:  Nothing ?Worsened by:  Movement and palpation ?Associated symptoms: diarrhea, nausea and vaginal bleeding   ?Associated symptoms: no chest pain, no constipation, no dysuria, no fever, no vaginal discharge and no vomiting   ?Risk factors: has not had multiple surgeries and not pregnant   ?Patient reports onset right lower quadrant abdominal pain over 24 hours ago.  She reports it is worsening.  She had nausea without vomiting.  She is also had diarrhea ?No previous abdominal surgeries.  She is currently on her menstrual cycle.  She does not recall having this pain previously ?  ?Patient reports previous history of fibroid ?Home Medications ?Prior to Admission medications   ?Medication Sig Start Date End Date Taking? Authorizing Provider  ?albuterol (PROVENTIL HFA;VENTOLIN HFA) 108 (90 Base) MCG/ACT inhaler Inhale 1-2 puffs into the lungs every 6 (six) hours as needed for wheezing or shortness of breath. 08/30/16  Yes Waynetta Pean, PA-C  ?clonazePAM (KLONOPIN) 0.5 MG tablet Take 0.5 mg by mouth daily as needed for anxiety. 03/25/15  Yes [provider]  ?HYDROcodone-acetaminophen (NORCO/VICODIN) 5-325 MG tablet Take 1 tablet by mouth every 6 (six) hours as needed for severe pain. 08/29/21  Yes Ripley Fraise, MD  ?ipratropium-albuterol (DUONEB) 0.5-2.5 (3) MG/3ML SOLN Take 3 mLs by nebulization every 6 (six) hours as needed (for shortness of breath). 05/13/21  Yes [provider]  ?loratadine (CLARITIN) 10 MG tablet Take 10 mg by mouth daily.   Yes [provider]  ?   ? ?Allergies    ?Corn-containing products, Erythromycin, Other, and Peanuts [peanut oil]   ? ?Review of Systems   ?Review of Systems  ?Constitutional:  Negative for fever.  ?Cardiovascular:  Negative for chest pain.  ?Gastrointestinal:  Positive for abdominal pain, diarrhea and nausea. Negative for constipation and vomiting.  ?Genitourinary:  Positive for vaginal bleeding. Negative for dysuria and vaginal discharge.  ?     Currently on menstrual cycle  ?All other systems reviewed and are negative. ? ?Physical Exam ?Updated Vital Signs ?BP (!) 171/101   Pulse 86   Temp 97.8 ?F (36.6 ?C) (Oral)   Resp 14   Ht 1.626 m ('5\' 4"'$ )   Wt 72.6 kg   SpO2 98%   BMI 27.46 kg/m?  ?Physical Exam ?CONSTITUTIONAL: Well developed/well nourished, uncomfortable appearing ?HEAD: Normocephalic/atraumatic ?EYES: EOMI/PERRL ?ENMT: Mucous membranes moist ?NECK: supple no meningeal signs ?SPINE/BACK:entire spine nontender ?CV: S1/S2 noted, no murmurs/rubs/gallops noted ?LUNGS: Lungs are clear to auscultation bilaterally, no apparent distress ?ABDOMEN: soft, moderate RLQ  tenderness, no rebound or guarding, bowel sounds noted throughout abdomen ?Tender firm area noted in right lower quadrant ?GU:no cva tenderness, pelvic exam deferred as patient is on menstrual cycle ?NEURO: Pt is awake/alert/appropriate, moves all extremitiesx4.  No facial droop.   ?EXTREMITIES: pulses normal/equal, full ROM ?SKIN: warm, color normal ?PSYCH: no abnormalities of mood noted, alert and oriented to situation ? ?ED Results / Procedures / Treatments   ?Labs ?(  all labs ordered are listed, but only abnormal results are displayed) ?Labs Reviewed  ?COMPREHENSIVE METABOLIC PANEL - Abnormal; Notable for the following components:  ?    Result Value  ? Potassium 3.1 (*)   ? Total Bilirubin 0.2 (*)   ? All other components within normal limits  ?CBC - Abnormal;  Notable for the following components:  ? WBC 14.1 (*)   ? Hemoglobin 11.4 (*)   ? HCT 34.9 (*)   ? Platelets 498 (*)   ? All other components within normal limits  ?URINALYSIS, ROUTINE W REFLEX MICROSCOPIC - Abnormal; Notable for the following components:  ? Color, Urine STRAW (*)   ? Hgb urine dipstick MODERATE (*)   ? Bacteria, UA RARE (*)   ? All other components within normal limits  ?LIPASE, BLOOD  ?I-STAT BETA HCG BLOOD, ED ([facility], [facility], [facility] ONLY)  ? ? ?EKG ?None ? ?Radiology ?CT ABDOMEN PELVIS W CONTRAST ? ?Result Date: 08/29/2021 ?CLINICAL DATA:  41 year old female with right lower quadrant abdominal pain for 1 day. EXAM: CT ABDOMEN AND PELVIS WITH CONTRAST TECHNIQUE: Multidetector CT imaging of the abdomen and pelvis was performed using the standard protocol following bolus administration of intravenous contrast. RADIATION DOSE REDUCTION: This exam was performed according to the departmental dose-optimization program which includes automated exposure control, adjustment of the mA and/or kV according to patient size and/or use of iterative reconstruction technique. CONTRAST:  132m OMNIPAQUE IOHEXOL 300 MG/ML  [facility] FINDINGS: Lower chest: Chronic bilateral middle lobe scarring and atelectasis appears stable since 2020. Both lung bases appears somewhat chronically hyperinflated also. No cardiomegaly, pericardial or pleural effusion. Hepatobiliary: Negative liver and gallbladder. Pancreas: Negative. Spleen: Negative. Adrenals/Urinary Tract: Normal adrenal glands. Kidneys appears symmetric and within normal limits aside from punctate right nephrolithiasis at the middle and lower pole. No hydronephrosis or hydroureter. No pararenal inflammation. Diminutive bladder with mass effect from enlarged uterus (sagittal image 71). Stomach/Bowel: Mostly decompressed large bowel with uterine mass effect on the colon  in the pelvis and at the pelvic inlet. Normal appendix on coronal image 53. Decompressed terminal ileum. No dilated small bowel. Decompressed stomach. No free air, free fluid, mesenteric inflammation. Vascular/Lymphatic: Major arterial structures in the abdomen and pelvis appear patent and normal. Portal venous system is patent. No lymphadenopathy identified. Reproductive: Severe fibroid uterus with progressive enlargement of multiple bulky and pedunculated uterine fibroids since 2020. The abnormal, lobulated uterus and masses now encompasses about 17 x 12 by 10 cm (AP by transverse by CC) for an estimated uterine volume of 1,020 mL. See series 2, image 59, sagittal image 71 and coronal image 54. Regional mass effect on all pelvic viscera. No parametrial inflammation identified. Ovaries appear to remain normal (both visible on series 2, image 62). Other: No pelvic free fluid. Musculoskeletal: No acute osseous abnormality identified. IMPRESSION: 1. Severe Fibroid Uterus with progressive enlargement of multiple bulky and pedunculated fibroids since 2020. Total estimated uterine volume now 1,020 mL with associated mass effect on the other lower abdominal and pelvic viscera. Recommend GYN Surgery follow-up. 2. Normal appendix, and no superimposed acute or inflammatory process identified in the abdomen or pelvis. Punctate right nephrolithiasis. Electronically Signed   By: HGenevie AnnM.D.   On: 08/29/2021 05:43   ? ?Procedures ?Procedures  ? ? ?Medications Ordered in ED ?Medications  ?ondansetron (ZOFRAN) injection 4 mg (4 mg Intravenous Given 08/29/21 0358)  ?fentaNYL (SUBLIMAZE)  injection 100 mcg (100 mcg Intravenous Given 08/29/21 0420)  ?iohexol (OMNIPAQUE) 300 MG/ML solution 100 mL (100 mLs Intravenous Contrast Given 08/29/21 0520)  ?sodium chloride (PF) 0.9 % injection (  Given by Other 08/29/21 0536)  ? ? ?ED Course/ Medical Decision Making/ A&P ?Clinical Course as of 08/29/21 0627  ?Fri Aug 29, 2021  ?0450 Patient with  continued right lower quadrant abdominal pain, will proceed with CT imaging [DW]  ?0627 Overall patient is improved.  She is in no acute distress.  We discussed CT findings.  CT reveals abnormally large fibroid

## 2021-08-29 NOTE — ED Notes (Signed)
Patient unable to provide urine sample

## 2021-09-03 ENCOUNTER — Encounter (HOSPITAL_COMMUNITY): Payer: Self-pay

## 2021-09-03 ENCOUNTER — Emergency Department (HOSPITAL_COMMUNITY)
Admission: EM | Admit: 2021-09-03 | Discharge: 2021-09-04 | Disposition: A | Discharge status: Home / Self Care | Payer: BC Managed Care – PPO | Attending: Emergency Medicine | Admitting: Emergency Medicine

## 2021-09-03 ENCOUNTER — Other Ambulatory Visit: Payer: Self-pay

## 2021-09-03 DIAGNOSIS — D259 Leiomyoma of uterus, unspecified: Secondary | ICD-10-CM | POA: Diagnosis not present

## 2021-09-03 DIAGNOSIS — R1030 Lower abdominal pain, unspecified: Secondary | ICD-10-CM | POA: Diagnosis present

## 2021-09-03 DIAGNOSIS — Z91010 Allergy to peanuts: Secondary | ICD-10-CM | POA: Insufficient documentation

## 2021-09-03 LAB — CBC WITH DIFFERENTIAL/PLATELET
Abs Immature Granulocytes: 0.04 10*3/uL (ref 0.00–0.07)
Basophils Absolute: 0.1 10*3/uL (ref 0.0–0.1)
Basophils Relative: 1 %
Eosinophils Absolute: 0.5 10*3/uL (ref 0.0–0.5)
Eosinophils Relative: 5 %
HCT: 37.9 % (ref 36.0–46.0)
Hemoglobin: 12.2 g/dL (ref 12.0–15.0)
Immature Granulocytes: 0 %
Lymphocytes Relative: 25 %
Lymphs Abs: 2.4 10*3/uL (ref 0.7–4.0)
MCH: 27.7 pg (ref 26.0–34.0)
MCHC: 32.2 g/dL (ref 30.0–36.0)
MCV: 85.9 fL (ref 80.0–100.0)
Monocytes Absolute: 0.6 10*3/uL (ref 0.1–1.0)
Monocytes Relative: 6 %
Neutro Abs: 6.1 10*3/uL (ref 1.7–7.7)
Neutrophils Relative %: 63 %
Platelets: 529 10*3/uL — ABNORMAL HIGH (ref 150–400)
RBC: 4.41 MIL/uL (ref 3.87–5.11)
RDW: 14.6 % (ref 11.5–15.5)
WBC: 9.7 10*3/uL (ref 4.0–10.5)
nRBC: 0 % (ref 0.0–0.2)

## 2021-09-03 LAB — I-STAT BETA HCG BLOOD, ED (MC, WL, AP ONLY): I-stat hCG, quantitative: <5 m[IU]/mL (ref <5)

## 2021-09-03 LAB — COMPREHENSIVE METABOLIC PANEL
ALT: 14 U/L (ref 0–44)
AST: 21 U/L (ref 15–41)
Albumin: 4.1 g/dL (ref 3.5–5.0)
Alkaline Phosphatase: 47 U/L (ref 38–126)
Anion gap: 9 (ref 5–15)
BUN: 16 mg/dL (ref 6–20)
CO2: 21 mmol/L — ABNORMAL LOW (ref 22–32)
Calcium: 9.4 mg/dL (ref 8.9–10.3)
Chloride: 109 mmol/L (ref 98–111)
Creatinine, Ser: 0.69 mg/dL (ref 0.44–1.00)
GFR, Estimated: >60 mL/min (ref >60)
Glucose, Bld: 160 mg/dL — ABNORMAL HIGH (ref 70–99)
Potassium: 3.6 mmol/L (ref 3.5–5.1)
Sodium: 139 mmol/L (ref 135–145)
Total Bilirubin: 0.3 mg/dL (ref 0.3–1.2)
Total Protein: 7.2 g/dL (ref 6.5–8.1)

## 2021-09-03 LAB — LIPASE, BLOOD: Lipase: 33 U/L (ref 11–51)

## 2021-09-03 MED ORDER — MORPHINE SULFATE (PF) 4 MG/ML IV SOLN
4.0000 mg | Freq: Once | INTRAVENOUS | Status: AC
  Administered 2021-09-03: 4 mg via INTRAVENOUS
  Filled 2021-09-03: qty 1

## 2021-09-03 MED ORDER — ONDANSETRON HCL 4 MG/2ML IJ SOLN
4.0000 mg | Freq: Once | INTRAMUSCULAR | Status: AC
  Administered 2021-09-03: 4 mg via INTRAVENOUS
  Filled 2021-09-03: qty 2

## 2021-09-03 NOTE — ED Triage Notes (Signed)
Pt reports that she was seen on Friday and continues to have lower abdominal pain due to an enlarged uterus. Pt states that she has an OB appt on April 24th but the pain is becoming more severe.  ?

## 2021-09-03 NOTE — ED Provider Notes (Signed)
?[facility] DEPT ?Provider Note ? ? ?CSN: [id number] ?Arrival date & time: 09/03/21  2033 ? ?  ? ?History ? ?Chief Complaint  ?Patient presents with  ? Abdominal Pain  ? ? ?Cheryl Delacruz is a 41 y.o. female. ? ?The history is provided by the patient and medical records.  ?Abdominal Pain ? ?41 y.o. F presenting to the ED with lower abdominal pain.  States she was seen here 08/29/2021 for same and diagnosed with severely fibroid uterus.  States she did speak with her PCP about this and was referred to GYN clinic and also called women's clinic on her own but no appt available until later this months.  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  States menstrual cycle was last week--- slightly heavier than usual with more intense cramping.  Denies fever.  No trouble urinating.  Has had some diarrhea. ? ?Home Medications ?Prior to Admission medications   ?Medication Sig Start Date End Date Taking? Authorizing Provider  ?albuterol (PROVENTIL HFA;VENTOLIN HFA) 108 (90 Base) MCG/ACT inhaler Inhale 1-2 puffs into the lungs every 6 (six) hours as needed for wheezing or shortness of breath. 08/30/16   Waynetta Pean, PA-C  ?clonazePAM (KLONOPIN) 0.5 MG tablet Take 0.5 mg by mouth daily as needed for anxiety. 03/25/15   [provider]  ?HYDROcodone-acetaminophen (NORCO/VICODIN) 5-325 MG tablet Take 1 tablet by mouth every 6 (six) hours as needed for severe pain. 08/29/21   Ripley Fraise, MD  ?ipratropium-albuterol (DUONEB) 0.5-2.5 (3) MG/3ML SOLN Take 3 mLs by nebulization every 6 (six) hours as needed (for shortness of breath). 05/13/21   [provider]  ?loratadine (CLARITIN) 10 MG tablet Take 10 mg by mouth daily.    [provider]  ?   ? ?Allergies    ?Corn-containing products, Erythromycin, Other, and Peanuts [peanut oil]   ? ?Review of Systems   ?Review of Systems  ?Gastrointestinal:  Positive for abdominal  pain.  ?All other systems reviewed and are negative. ? ?Physical Exam ?Updated Vital Signs ?BP (!) 163/100 (BP Location: Left Arm)   Pulse (!) 112   Temp 98 ?F (36.7 ?C) (Oral)   Resp 17   Ht '5\' 4"'$  (1.626 m)   Wt 72.6 kg   SpO2 99%   BMI 27.46 kg/m?  ? ?Physical Exam ?Vitals and nursing note reviewed.  ?Constitutional:   ?   Appearance: She is well-developed.  ?HENT:  ?   Head: Normocephalic and atraumatic.  ?Eyes:  ?   Conjunctiva/sclera: Conjunctivae normal.  ?   Pupils: Pupils are equal, round, and reactive to light.  ?Cardiovascular:  ?   Rate and Rhythm: Normal rate and regular rhythm.  ?   Heart sounds: Normal heart sounds.  ?Pulmonary:  ?   Effort: Pulmonary effort is normal.  ?   Breath sounds: Normal breath sounds.  ?Abdominal:  ?   General: Bowel sounds are normal.  ?   Palpations: Abdomen is soft.  ?   Comments: Appears somewhat distended suprapubic area, no peritoneal signs, pain actually improves when pressure applied to lower abdomen  ?Musculoskeletal:     ?   General: Normal range of motion.  ?   Cervical back: Normal range of motion.  ?Skin: ?   General: Skin is warm and dry.  ?Neurological:  ?   Mental Status: She is alert and oriented to person, place, and time.  ? ? ?ED Results / Procedures /  Treatments   ?Labs ?(all labs ordered are listed, but only abnormal results are displayed) ?Labs Reviewed  ?COMPREHENSIVE METABOLIC PANEL - Abnormal; Notable for the following components:  ?    Result Value  ? CO2 21 (*)   ? Glucose, Bld 160 (*)   ? All other components within normal limits  ?CBC WITH DIFFERENTIAL/PLATELET - Abnormal; Notable for the following components:  ? Platelets 529 (*)   ? All other components within normal limits  ?LIPASE, BLOOD  ?I-STAT BETA HCG BLOOD, ED ([facility], [facility], [facility] ONLY)  ? ? ?EKG ?None ? ?Radiology ?No results found. ? ?Procedures ?Procedures  ? ? ?Medications Ordered in ED ?Medications  ?morphine (PF) 4 MG/ML injection 4 mg (4 mg Intravenous Given 09/03/21 2241)   ?ondansetron [facility]) injection 4 mg (4 mg Intravenous Given 09/03/21 2241)  ?HYDROmorphone (DILAUDID) injection 1 mg (1 mg Intravenous Given 09/04/21 0027)  ? ? ?ED Course/ Medical Decision Making/ A&P ?  ?                        ?Medical Decision Making ?Amount and/or Complexity of Data Reviewed ?Labs: ordered. ? ?Risk ?Prescription drug management. ? ? ?41 year old female presenting to the ED with lower abdominal discomfort.  Seen here 08/29/2021 for same and diagnosed with a very large, fibroid uterus compressing other pelvic/lower abdominal organs.  She does continue to urinate and have bowel movements regularly.  She is not have any any vomiting or other obstructive symptoms.  No breakthrough bleeding since finishing her menstrual cycle.  Pain remains localized to suprapubic area without lateralizing left or right. Labs today are overall reassuring. I reviewed her CT from a few days ago, uterus is quite large and I could clearly see how this could be causing discomfort.  Do not feel repeat imaging will be of added benefit at this time which I discussed with patient and she is agreeable.  Will attempt pain control.   ? ?Patient not able to see GYN until end of the month.  I spoke with OB-GYN on call, Dr. Nelda Marseille-- have [place] her the chart to try and help expedite follow-up in clinic. ? ?1:09 AM ?Much more comfortable after dose of morphine and dilaudid.  Feels she can go home.  Was supposed to travel out of state this week for work, note provided to remain local for expedited follow-up.  Rx percocet for now.  Follow-up with OB-GYN.  Return here for any new/acute changes. ? ?Final Clinical Impression(s) / ED Diagnoses ?Final diagnoses:  ?Uterine leiomyoma, unspecified location  ? ? ?Rx / DC Orders ?ED Discharge Orders   ? ?      Ordered  ?  oxyCODONE-acetaminophen (PERCOCET) 5-325 MG tablet  Every 4 hours PRN       ? 09/04/21 0116  ? ?  ?  ? ?  ? ? ?  ?Larene Pickett, PA-C ?09/04/21 0148 ? ?  ?Lucrezia Starch,  MD ?09/04/21 2214 ? ?

## 2021-09-04 MED ORDER — HYDROMORPHONE HCL 1 MG/ML IJ SOLN
1.0000 mg | Freq: Once | INTRAMUSCULAR | Status: AC
Start: 2021-09-04 — End: 2021-09-04
  Administered 2021-09-04: 1 mg via INTRAVENOUS
  Filled 2021-09-04: qty 1

## 2021-09-04 MED ORDER — OXYCODONE-ACETAMINOPHEN 5-325 MG PO TABS: 1.0000 | ORAL_TABLET | ORAL | 0 refills | Status: DC | PRN

## 2021-09-04 NOTE — Discharge Instructions (Signed)
Take the prescribed medication as directed.  Do not drive while taking this, can make you drowsy/sleepy. ?Someone from scheduling it outpatient clinic should be calling you with a sooner appointment.  If you do not hear from them in the next 48 hours, I would call the office and follow-up on this. ?Return to the ED for new or worsening symptoms. ?

## 2021-11-20 ENCOUNTER — Encounter: Payer: Self-pay | Admitting: Obstetrics & Gynecology

## 2022-03-13 ENCOUNTER — Emergency Department (HOSPITAL_COMMUNITY)
Admission: EM | Admit: 2022-03-13 | Discharge: 2022-03-13 | Discharge status: Against Medical Advice | Payer: BC Managed Care – PPO | Attending: Emergency Medicine | Admitting: Emergency Medicine

## 2022-03-13 ENCOUNTER — Emergency Department (HOSPITAL_BASED_OUTPATIENT_CLINIC_OR_DEPARTMENT_OTHER): Payer: BC Managed Care – PPO

## 2022-03-13 ENCOUNTER — Other Ambulatory Visit: Payer: Self-pay

## 2022-03-13 ENCOUNTER — Encounter (HOSPITAL_BASED_OUTPATIENT_CLINIC_OR_DEPARTMENT_OTHER): Payer: Self-pay

## 2022-03-13 ENCOUNTER — Encounter (HOSPITAL_COMMUNITY): Payer: Self-pay | Admitting: Emergency Medicine

## 2022-03-13 ENCOUNTER — Emergency Department (HOSPITAL_COMMUNITY)
Admission: EM | Admit: 2022-03-13 | Discharge: 2022-03-14 | Disposition: A | Discharge status: Home / Self Care | Payer: BC Managed Care – PPO | Source: Home / Self Care | Attending: Emergency Medicine | Admitting: Emergency Medicine

## 2022-03-13 DIAGNOSIS — J45909 Unspecified asthma, uncomplicated: Secondary | ICD-10-CM | POA: Insufficient documentation

## 2022-03-13 DIAGNOSIS — R1013 Epigastric pain: Secondary | ICD-10-CM | POA: Insufficient documentation

## 2022-03-13 DIAGNOSIS — R112 Nausea with vomiting, unspecified: Secondary | ICD-10-CM | POA: Insufficient documentation

## 2022-03-13 DIAGNOSIS — Z5321 Procedure and treatment not carried out due to patient leaving prior to being seen by health care provider: Secondary | ICD-10-CM | POA: Insufficient documentation

## 2022-03-13 DIAGNOSIS — R109 Unspecified abdominal pain: Secondary | ICD-10-CM | POA: Insufficient documentation

## 2022-03-13 DIAGNOSIS — R110 Nausea: Secondary | ICD-10-CM | POA: Insufficient documentation

## 2022-03-13 LAB — URINALYSIS, ROUTINE W REFLEX MICROSCOPIC
Bilirubin Urine: NEGATIVE
Glucose, UA: NEGATIVE mg/dL
Ketones, ur: NEGATIVE mg/dL
Leukocytes,Ua: NEGATIVE
Nitrite: NEGATIVE
Specific Gravity, Urine: 1.03 (ref 1.005–1.030)
pH: 6 (ref 5.0–8.0)

## 2022-03-13 LAB — COMPREHENSIVE METABOLIC PANEL
ALT: 17 U/L (ref 0–44)
AST: 17 U/L (ref 15–41)
Albumin: 4.2 g/dL (ref 3.5–5.0)
Alkaline Phosphatase: 67 U/L (ref 38–126)
Anion gap: 8 (ref 5–15)
BUN: 12 mg/dL (ref 6–20)
CO2: 23 mmol/L (ref 22–32)
Calcium: 9.2 mg/dL (ref 8.9–10.3)
Chloride: 106 mmol/L (ref 98–111)
Creatinine, Ser: 0.79 mg/dL (ref 0.44–1.00)
GFR, Estimated: >60 mL/min (ref >60)
Glucose, Bld: 101 mg/dL — ABNORMAL HIGH (ref 70–99)
Potassium: 3.2 mmol/L — ABNORMAL LOW (ref 3.5–5.1)
Sodium: 137 mmol/L (ref 135–145)
Total Bilirubin: 0.4 mg/dL (ref 0.3–1.2)
Total Protein: 6.9 g/dL (ref 6.5–8.1)

## 2022-03-13 LAB — CBC
HCT: 32.9 % — ABNORMAL LOW (ref 36.0–46.0)
Hemoglobin: 10.5 g/dL — ABNORMAL LOW (ref 12.0–15.0)
MCH: 25.3 pg — ABNORMAL LOW (ref 26.0–34.0)
MCHC: 31.9 g/dL (ref 30.0–36.0)
MCV: 79.3 fL — ABNORMAL LOW (ref 80.0–100.0)
Platelets: 532 10*3/uL — ABNORMAL HIGH (ref 150–400)
RBC: 4.15 MIL/uL (ref 3.87–5.11)
RDW: 14.3 % (ref 11.5–15.5)
WBC: 15.3 10*3/uL — ABNORMAL HIGH (ref 4.0–10.5)
nRBC: 0 % (ref 0.0–0.2)

## 2022-03-13 LAB — PREGNANCY, URINE: Preg Test, Ur: NEGATIVE

## 2022-03-13 LAB — HCG, QUANTITATIVE, PREGNANCY: hCG, Beta Chain, Quant, S: 1 m[IU]/mL (ref <5)

## 2022-03-13 LAB — LIPASE, BLOOD: Lipase: 27 U/L (ref 11–51)

## 2022-03-13 MED ORDER — SODIUM CHLORIDE 0.9 % IV BOLUS
500.0000 mL | Freq: Once | INTRAVENOUS | Status: AC
  Administered 2022-03-14: 500 mL via INTRAVENOUS

## 2022-03-13 MED ORDER — IOHEXOL 300 MG/ML  SOLN
100.0000 mL | Freq: Once | INTRAMUSCULAR | Status: AC | PRN
  Administered 2022-03-13: 80 mL via INTRAVENOUS

## 2022-03-13 MED ORDER — ONDANSETRON HCL 4 MG/2ML IJ SOLN
4.0000 mg | Freq: Once | INTRAMUSCULAR | Status: AC
Start: 2022-03-13 — End: 2022-03-13
  Administered 2022-03-13: 4 mg via INTRAVENOUS
  Filled 2022-03-13: qty 2

## 2022-03-13 MED ORDER — FENTANYL CITRATE PF 50 MCG/ML IJ SOSY
100.0000 ug | PREFILLED_SYRINGE | Freq: Once | INTRAMUSCULAR | Status: AC
  Administered 2022-03-13: 100 ug via INTRAVENOUS
  Filled 2022-03-13: qty 2

## 2022-03-13 MED ORDER — ONDANSETRON 4 MG PO TBDP: 4.0000 mg | ORAL_TABLET | Freq: Once | ORAL | Status: DC | PRN

## 2022-03-13 NOTE — ED Triage Notes (Signed)
Patient here POV from Home.  Endorses ABD Pain (Cramping-Like) that began 1.5 Weeks. Originally to Lower ABD but now banding across Mid ABD and radiates around to Back.   Myomectomy performed 8 Weeks ago. Some nausea. 1 Episode of Emesis. No Diarrhea. No known Fevers.   NAD Noted during Triage. A&Ox4. GCS 15. Ambulatory.

## 2022-03-13 NOTE — ED Notes (Signed)
I called patient to recheck vitals and no one responded

## 2022-03-13 NOTE — ED Provider Notes (Signed)
Emergency Department Provider Note   I have reviewed the triage vital signs and the nursing notes.   HISTORY  Chief Complaint Abdominal Pain   HPI Cheryl Delacruz is a 41 y.o. female with PMH of asthma and 8 weeks s/p myomectomy presents to the emergency department with severe bandlike abdominal discomfort with nausea/vomiting.  No vaginal discharge.  She did start her menstrual cycle recently but states she has had a menstrual cycle since her procedure without significant discomfort.  No pain radiating into the chest or shortness of breath.  No leg swelling.  No complications from her surgery.  Her incisions have been healing well and she is been eating without difficulty.  Past Medical History:  Diagnosis Date   Asthma    Kidney stone     Review of Systems  Constitutional: No fever/chills Eyes: No visual changes. ENT: No sore throat. Cardiovascular: Denies chest pain. Respiratory: Denies shortness of breath. Gastrointestinal: Positive abdominal pain. Positive nausea and vomiting.  No diarrhea.  No constipation. Genitourinary: Negative for dysuria. Musculoskeletal: Negative for back pain. Skin: Negative for rash. Neurological: Negative for headaches, focal weakness or numbness.   ____________________________________________   PHYSICAL EXAM:  VITAL SIGNS: ED Triage Vitals  Enc Vitals Group     BP 03/13/22 2108 (!) 179/102     Pulse Rate 03/13/22 2108 95     Resp 03/13/22 2108 18     Temp 03/13/22 2108 98.2 F (36.8 C)     Temp Source 03/13/22 2108 Oral     SpO2 03/13/22 2108 100 %     Weight 03/13/22 2104 160 lb 0.9 oz (72.6 kg)     Height 03/13/22 2104 '5\' 4"'$  (1.626 m)   Constitutional: Alert and oriented. Well appearing and in no acute distress. Eyes: Conjunctivae are normal.  Head: Atraumatic. Nose: No congestion/rhinnorhea. Mouth/Throat: Mucous membranes are moist.  Neck: No stridor.  Cardiovascular: Normal rate, regular rhythm. Good peripheral  circulation. Grossly normal heart sounds.   Respiratory: Normal respiratory effort.  No retractions. Lungs CTAB. Gastrointestinal: Soft with mild diffuse tenderness. No distention. Well appearing incisions. Musculoskeletal:  No gross deformities of extremities. Neurologic:  Normal speech and language. Skin:  Skin is warm, dry and intact. No rash noted.  ____________________________________________   LABS (all labs ordered are listed, but only abnormal results are displayed)  Labs Reviewed  URINALYSIS, ROUTINE W REFLEX MICROSCOPIC - Abnormal; Notable for the following components:      Result Value   Hgb urine dipstick MODERATE (*)    Protein, ur TRACE (*)    All other components within normal limits  PREGNANCY, URINE  COMPREHENSIVE METABOLIC PANEL  LIPASE, BLOOD  CBC WITH DIFFERENTIAL/PLATELET   __________________________________  RADIOLOGY  No results found.  ____________________________________________   PROCEDURES  Procedure(s) performed:   Procedures  None  ____________________________________________   INITIAL IMPRESSION / ASSESSMENT AND PLAN / ED COURSE  Pertinent labs & imaging results that were available during my care of the patient were reviewed by me and considered in my medical decision making (see chart for details).   This patient is Presenting for Evaluation of abdominal pain, which does require a range of treatment options, and is a complaint that involves a high risk of morbidity and mortality.  The Differential Diagnoses includes but is not exclusive to acute cholecystitis, intrathoracic causes for epigastric abdominal pain, gastritis, duodenitis, pancreatitis, small bowel or large bowel obstruction, abdominal aortic aneurysm, hernia, gastritis, etc.   Critical Interventions-    Medications  sodium chloride 0.9 % bolus 500 mL (has no administration in time range)  ondansetron (ZOFRAN) injection 4 mg (4 mg Intravenous Given 03/13/22 2355)   fentaNYL (SUBLIMAZE) injection 100 mcg (100 mcg Intravenous Given 03/13/22 2356)  iohexol (OMNIPAQUE) 300 MG/ML solution 100 mL (80 mLs Intravenous Contrast Given 03/13/22 2339)    Reassessment after intervention:     I *** Additional Historical Information from ***, as the patient is ***.  I decided to review pertinent External Data, and in summary ***.   Clinical Laboratory Tests Ordered, included   Radiologic Tests Ordered, included ***. I independently interpreted the images and agree with radiology interpretation.   Cardiac Monitor Tracing which shows ***   Social Determinants of Health Risk ***  Consult complete with  Medical Decision Making: Summary: ***  Reevaluation with update and discussion with   ***Considered admission***  Disposition:   ____________________________________________  FINAL CLINICAL IMPRESSION(S) / ED DIAGNOSES  Final diagnoses:  None     NEW OUTPATIENT MEDICATIONS STARTED DURING THIS VISIT:  New Prescriptions   No medications on file    Note:  This document was prepared using Dragon voice recognition software and may include unintentional dictation errors.  Nanda Quinton, MD, [facility] Emergency Medicine

## 2022-03-13 NOTE — ED Triage Notes (Signed)
pt reports abd pain after having surgery. Pt reports nausea as well.

## 2022-03-14 ENCOUNTER — Emergency Department (HOSPITAL_COMMUNITY): Payer: BC Managed Care – PPO

## 2022-03-14 LAB — CBC WITH DIFFERENTIAL/PLATELET
Abs Immature Granulocytes: 0.05 10*3/uL (ref 0.00–0.07)
Basophils Absolute: 0 10*3/uL (ref 0.0–0.1)
Basophils Relative: 0 %
Eosinophils Absolute: 0.3 10*3/uL (ref 0.0–0.5)
Eosinophils Relative: 2 %
HCT: 28.6 % — ABNORMAL LOW (ref 36.0–46.0)
Hemoglobin: 9.3 g/dL — ABNORMAL LOW (ref 12.0–15.0)
Immature Granulocytes: 0 %
Lymphocytes Relative: 21 %
Lymphs Abs: 2.8 10*3/uL (ref 0.7–4.0)
MCH: 25.3 pg — ABNORMAL LOW (ref 26.0–34.0)
MCHC: 32.5 g/dL (ref 30.0–36.0)
MCV: 77.7 fL — ABNORMAL LOW (ref 80.0–100.0)
Monocytes Absolute: 0.8 10*3/uL (ref 0.1–1.0)
Monocytes Relative: 6 %
Neutro Abs: 9.3 10*3/uL — ABNORMAL HIGH (ref 1.7–7.7)
Neutrophils Relative %: 71 %
Platelets: 462 10*3/uL — ABNORMAL HIGH (ref 150–400)
RBC: 3.68 MIL/uL — ABNORMAL LOW (ref 3.87–5.11)
RDW: 14.4 % (ref 11.5–15.5)
WBC: 13.2 10*3/uL — ABNORMAL HIGH (ref 4.0–10.5)
nRBC: 0 % (ref 0.0–0.2)

## 2022-03-14 LAB — COMPREHENSIVE METABOLIC PANEL
ALT: 10 U/L (ref 0–44)
AST: 11 U/L — ABNORMAL LOW (ref 15–41)
Albumin: 4 g/dL (ref 3.5–5.0)
Alkaline Phosphatase: 60 U/L (ref 38–126)
Anion gap: 10 (ref 5–15)
BUN: 11 mg/dL (ref 6–20)
CO2: 21 mmol/L — ABNORMAL LOW (ref 22–32)
Calcium: 8.7 mg/dL — ABNORMAL LOW (ref 8.9–10.3)
Chloride: 105 mmol/L (ref 98–111)
Creatinine, Ser: 0.64 mg/dL (ref 0.44–1.00)
GFR, Estimated: >60 mL/min (ref >60)
Glucose, Bld: 95 mg/dL (ref 70–99)
Potassium: 3.1 mmol/L — ABNORMAL LOW (ref 3.5–5.1)
Sodium: 136 mmol/L (ref 135–145)
Total Bilirubin: 0.3 mg/dL (ref 0.3–1.2)
Total Protein: 6.2 g/dL — ABNORMAL LOW (ref 6.5–8.1)

## 2022-03-14 LAB — LIPASE, BLOOD: Lipase: 14 U/L (ref 11–51)

## 2022-03-14 MED ORDER — POTASSIUM CHLORIDE CRYS ER 20 MEQ PO TBCR
40.0000 meq | EXTENDED_RELEASE_TABLET | Freq: Once | ORAL | Status: AC
  Administered 2022-03-14: 40 meq via ORAL
  Filled 2022-03-14: qty 2

## 2022-03-14 MED ORDER — PANTOPRAZOLE SODIUM 40 MG PO TBEC: 40.0000 mg | DELAYED_RELEASE_TABLET | Freq: Every day | ORAL | 0 refills | Status: DC

## 2022-03-14 MED ORDER — ONDANSETRON 8 MG PO TBDP: 8.0000 mg | ORAL_TABLET | Freq: Three times a day (TID) | ORAL | 0 refills | Status: AC | PRN

## 2022-03-14 MED ORDER — PANTOPRAZOLE SODIUM 40 MG PO TBEC
40.0000 mg | DELAYED_RELEASE_TABLET | Freq: Once | ORAL | Status: AC
  Administered 2022-03-14: 40 mg via ORAL
  Filled 2022-03-14: qty 1

## 2022-03-14 MED ORDER — POTASSIUM CHLORIDE CRYS ER 20 MEQ PO TBCR: 20.0000 meq | EXTENDED_RELEASE_TABLET | Freq: Two times a day (BID) | ORAL | 0 refills | Status: AC

## 2022-03-14 NOTE — ED Provider Notes (Signed)
Patient transferred from [place] for ultrasound of the abdomen.  She is complaining of intermittent, burning pain across the mid abdomen which has been present for about a week.  She is 2 months status post uterine myomectomy.  CT scan had shown some questionable stranding around the common bile duct.  Right upper quadrant ultrasound shows no acute process.  I have independently viewed the images, and agree with radiologist's interpretation.  I have reviewed her laboratory tests and my interpretation is hypokalemia, anemia.  I have ordered oral potassium.  I have reviewed and interpreted her electrocardiogram and my interpretation is nonspecific ST and T wave changes of uncertain clinical significance.  At this point, I feel her pain is most likely related to possible GERD versus ulcer.  I have ordered a dose of pantoprazole.  I am discharging her with prescriptions for pantoprazole and potassium and I am referring her to gastroenterology for follow-up.   EKG Interpretation  Date/Time:  Friday March 13 2022 21:13:06 EDT Ventricular Rate:  89 PR Interval:  130 QRS Duration: 92 QT Interval:  336 QTC Calculation: 408 R Axis:   94 Text Interpretation: Normal sinus rhythm Rightward axis ST & T wave abnormality, consider inferolateral ischemia Abnormal ECG No previous ECGs available Confirmed by Delora Fuel ([id number]) on 03/14/2022 3:01:42 AM       Results for orders placed or performed during the hospital encounter of 03/13/22  Urinalysis, Routine w reflex microscopic Urine, Clean Catch  Result Value Ref Range   Color, Urine YELLOW YELLOW   APPearance CLEAR CLEAR   Specific Gravity, Urine 1.030 1.005 - 1.030   pH 6.0 5.0 - 8.0   Glucose, UA NEGATIVE NEGATIVE mg/dL   Hgb urine dipstick MODERATE (A) NEGATIVE   Bilirubin Urine NEGATIVE NEGATIVE   Ketones, ur NEGATIVE NEGATIVE mg/dL   Protein, ur TRACE (A) NEGATIVE mg/dL   Nitrite NEGATIVE NEGATIVE   Leukocytes,Ua NEGATIVE NEGATIVE    RBC / HPF 0-5 0 - 5 RBC/hpf   WBC, UA 0-5 0 - 5 WBC/hpf   Squamous Epithelial / LPF 0-5 0 - 5   Mucus PRESENT    Hyaline Casts, UA PRESENT   Comprehensive metabolic panel  Result Value Ref Range   Sodium 136 135 - 145 mmol/L   Potassium 3.1 (L) 3.5 - 5.1 mmol/L   Chloride 105 98 - 111 mmol/L   CO2 21 (L) 22 - 32 mmol/L   Glucose, Bld 95 70 - 99 mg/dL   BUN 11 6 - 20 mg/dL   Creatinine, Ser 0.64 0.44 - 1.00 mg/dL   Calcium 8.7 (L) 8.9 - 10.3 mg/dL   Total Protein 6.2 (L) 6.5 - 8.1 g/dL   Albumin 4.0 3.5 - 5.0 g/dL   AST 11 (L) 15 - 41 U/L   ALT 10 0 - 44 U/L   Alkaline Phosphatase 60 38 - 126 U/L   Total Bilirubin 0.3 0.3 - 1.2 mg/dL   GFR, Estimated >60 >60 mL/min   Anion gap 10 5 - 15  Lipase, blood  Result Value Ref Range   Lipase 14 11 - 51 U/L  CBC with Differential  Result Value Ref Range   WBC 13.2 (H) 4.0 - 10.5 K/uL   RBC 3.68 (L) 3.87 - 5.11 MIL/uL   Hemoglobin 9.3 (L) 12.0 - 15.0 g/dL   HCT 28.6 (L) 36.0 - 46.0 %   MCV 77.7 (L) 80.0 - 100.0 fL   MCH 25.3 (L) 26.0 - 34.0 pg   MCHC  32.5 30.0 - 36.0 g/dL   RDW 14.4 11.5 - 15.5 %   Platelets 462 (H) 150 - 400 K/uL   nRBC 0.0 0.0 - 0.2 %   Neutrophils Relative % 71 %   Neutro Abs 9.3 (H) 1.7 - 7.7 K/uL   Lymphocytes Relative 21 %   Lymphs Abs 2.8 0.7 - 4.0 K/uL   Monocytes Relative 6 %   Monocytes Absolute 0.8 0.1 - 1.0 K/uL   Eosinophils Relative 2 %   Eosinophils Absolute 0.3 0.0 - 0.5 K/uL   Basophils Relative 0 %   Basophils Absolute 0.0 0.0 - 0.1 K/uL   Immature Granulocytes 0 %   Abs Immature Granulocytes 0.05 0.00 - 0.07 K/uL  Pregnancy, urine  Result Value Ref Range   Preg Test, Ur NEGATIVE NEGATIVE   US Abdomen Limited RUQ (LIVER/GB)  Result Date: 03/14/2022 CLINICAL DATA:  Abdominal pain EXAM: ULTRASOUND ABDOMEN LIMITED RIGHT UPPER QUADRANT COMPARISON:  CT from the previous day. FINDINGS: Gallbladder: No gallstones or wall thickening visualized. No sonographic Murphy sign noted by  sonographer. Common bile duct: Diameter: 6 mm Liver: No focal lesion identified. Within normal limits in parenchymal echogenicity. Portal vein is patent on color Doppler imaging with normal direction of blood flow towards the liver. Other: None. IMPRESSION: No acute abnormality in the right upper quadrant is noted. Electronically Signed   By: Inez Catalina M.D.   On: 03/14/2022 02:40   CT ABDOMEN PELVIS W CONTRAST  Result Date: 03/14/2022 CLINICAL DATA:  Postop abdominal pain. History of myomectomy performed 8 weeks ago. EXAM: CT ABDOMEN AND PELVIS WITH CONTRAST TECHNIQUE: Multidetector CT imaging of the abdomen and pelvis was performed using the standard protocol following bolus administration of intravenous contrast. RADIATION DOSE REDUCTION: This exam was performed according to the departmental dose-optimization program which includes automated exposure control, adjustment of the mA and/or kV according to patient size and/or use of iterative reconstruction technique. CONTRAST:  8m OMNIPAQUE IOHEXOL 300 MG/ML  SOLN COMPARISON:  08/29/2021. FINDINGS: Lower chest: No acute abnormality. Hepatobiliary: No focal liver abnormality is seen. The gallbladder is without stones. No intrahepatic biliary ductal dilatation. The common bile duct is mildly distended at 1 cm with associated subtle fat stranding, best seen on axial image 23. Pancreas: Unremarkable. No pancreatic ductal dilatation or surrounding inflammatory changes. Spleen: Normal in size without focal abnormality. Adrenals/Urinary Tract: The adrenal glands are within normal limits. The kidneys enhance dramatically. Punctate nonobstructive renal calculi are noted bilaterally. No hydroureteronephrosis. The bladder is unremarkable. Stomach/Bowel: The stomach is within normal limits. A diverticulum is noted in the third portion of the duodenum. No bowel obstruction, free air, or pneumatosis. A few scattered diverticula are present along the colon without  evidence of diverticulitis. The visualized portion of the appendix is within normal limits. No focal bowel wall thickening or surrounding inflammatory changes. Vascular/Lymphatic: No significant vascular findings are present. No enlarged abdominal or pelvic lymph nodes. Reproductive: The uterus is enlarged and contains multiple hypo and hyperdense masses, compatible with fibroids. No adnexal mass. Other: No abdominopelvic ascites. Musculoskeletal: No acute osseous abnormality. IMPRESSION: 1. Mildly distended common bile duct at 1 cm with subtle fat stranding. Ultrasound is recommended for further evaluation. 2. Punctate nonobstructive right renal calculi. 3. Enlarged fibroid uterus, improved from the prior exam. Electronically Signed   By: LBrett FairyM.D.   On: [id number]     GDelora Fuel MD [id number]

## 2022-03-14 NOTE — Discharge Instructions (Addendum)
Please take the medication as prescribed.    Make a follow-up appointment with the gastroenterologist.   Return to the emergency department if symptoms are worsening.

## 2022-03-14 NOTE — ED Notes (Signed)
IV was secured for POV transport. Spoke to Agricultural consultant at [employer] to give short report.

## 2022-11-27 ENCOUNTER — Emergency Department (HOSPITAL_COMMUNITY): Payer: BC Managed Care – PPO

## 2022-11-27 ENCOUNTER — Encounter (HOSPITAL_COMMUNITY): Payer: Self-pay

## 2022-11-27 ENCOUNTER — Inpatient Hospital Stay (HOSPITAL_COMMUNITY)
Admission: EM | Admit: 2022-11-27 | Discharge: 2022-12-01 | DRG: 392 | Disposition: A | Discharge status: Home / Self Care | Payer: BC Managed Care – PPO | Attending: Internal Medicine | Admitting: Internal Medicine

## 2022-11-27 ENCOUNTER — Other Ambulatory Visit: Payer: Self-pay

## 2022-11-27 DIAGNOSIS — R000 Tachycardia, unspecified: Secondary | ICD-10-CM | POA: Diagnosis present

## 2022-11-27 DIAGNOSIS — F17210 Nicotine dependence, cigarettes, uncomplicated: Secondary | ICD-10-CM | POA: Diagnosis present

## 2022-11-27 DIAGNOSIS — K439 Ventral hernia without obstruction or gangrene: Secondary | ICD-10-CM

## 2022-11-27 DIAGNOSIS — Z91018 Allergy to other foods: Secondary | ICD-10-CM

## 2022-11-27 DIAGNOSIS — Z79899 Other long term (current) drug therapy: Secondary | ICD-10-CM

## 2022-11-27 DIAGNOSIS — E162 Hypoglycemia, unspecified: Secondary | ICD-10-CM | POA: Diagnosis not present

## 2022-11-27 DIAGNOSIS — E860 Dehydration: Secondary | ICD-10-CM | POA: Diagnosis present

## 2022-11-27 DIAGNOSIS — D75839 Thrombocytosis, unspecified: Secondary | ICD-10-CM | POA: Diagnosis present

## 2022-11-27 DIAGNOSIS — D509 Iron deficiency anemia, unspecified: Secondary | ICD-10-CM | POA: Diagnosis present

## 2022-11-27 DIAGNOSIS — E876 Hypokalemia: Secondary | ICD-10-CM | POA: Diagnosis present

## 2022-11-27 DIAGNOSIS — R8290 Unspecified abnormal findings in urine: Secondary | ICD-10-CM | POA: Diagnosis present

## 2022-11-27 DIAGNOSIS — E871 Hypo-osmolality and hyponatremia: Secondary | ICD-10-CM | POA: Diagnosis present

## 2022-11-27 DIAGNOSIS — K529 Noninfective gastroenteritis and colitis, unspecified: Principal | ICD-10-CM | POA: Diagnosis present

## 2022-11-27 DIAGNOSIS — Z833 Family history of diabetes mellitus: Secondary | ICD-10-CM

## 2022-11-27 DIAGNOSIS — Z91010 Allergy to peanuts: Secondary | ICD-10-CM

## 2022-11-27 DIAGNOSIS — Z881 Allergy status to other antibiotic agents status: Secondary | ICD-10-CM

## 2022-11-27 DIAGNOSIS — Z87442 Personal history of urinary calculi: Secondary | ICD-10-CM

## 2022-11-27 DIAGNOSIS — K432 Incisional hernia without obstruction or gangrene: Secondary | ICD-10-CM | POA: Diagnosis present

## 2022-11-27 DIAGNOSIS — E8721 Acute metabolic acidosis: Secondary | ICD-10-CM | POA: Diagnosis present

## 2022-11-27 DIAGNOSIS — I10 Essential (primary) hypertension: Secondary | ICD-10-CM | POA: Diagnosis present

## 2022-11-27 DIAGNOSIS — R1031 Right lower quadrant pain: Secondary | ICD-10-CM | POA: Diagnosis not present

## 2022-11-27 DIAGNOSIS — Z8249 Family history of ischemic heart disease and other diseases of the circulatory system: Secondary | ICD-10-CM

## 2022-11-27 DIAGNOSIS — J45909 Unspecified asthma, uncomplicated: Secondary | ICD-10-CM | POA: Diagnosis present

## 2022-11-27 LAB — CBC WITH DIFFERENTIAL/PLATELET
Abs Immature Granulocytes: 0.11 10*3/uL — ABNORMAL HIGH (ref 0.00–0.07)
Abs Immature Granulocytes: 0.17 10*3/uL — ABNORMAL HIGH (ref 0.00–0.07)
Basophils Absolute: 0.1 10*3/uL (ref 0.0–0.1)
Basophils Absolute: 0.1 10*3/uL (ref 0.0–0.1)
Basophils Relative: 1 %
Basophils Relative: 1 %
Eosinophils Absolute: 0.4 10*3/uL (ref 0.0–0.5)
Eosinophils Absolute: 0.1 10*3/uL (ref 0.0–0.5)
Eosinophils Relative: 2 %
Eosinophils Relative: 1 %
HCT: 27.7 % — ABNORMAL LOW (ref 36.0–46.0)
HCT: 28.4 % — ABNORMAL LOW (ref 36.0–46.0)
Hemoglobin: 8.6 g/dL — ABNORMAL LOW (ref 12.0–15.0)
Hemoglobin: 8.3 g/dL — ABNORMAL LOW (ref 12.0–15.0)
Immature Granulocytes: 1 %
Immature Granulocytes: 1 %
Lymphocytes Relative: 11 %
Lymphocytes Relative: 7 %
Lymphs Abs: 2.2 10*3/uL (ref 0.7–4.0)
Lymphs Abs: 1.5 10*3/uL (ref 0.7–4.0)
MCH: 22.1 pg — ABNORMAL LOW (ref 26.0–34.0)
MCH: 21.8 pg — ABNORMAL LOW (ref 26.0–34.0)
MCHC: 31 g/dL (ref 30.0–36.0)
MCHC: 29.2 g/dL — ABNORMAL LOW (ref 30.0–36.0)
MCV: 71.2 fL — ABNORMAL LOW (ref 80.0–100.0)
MCV: 74.7 fL — ABNORMAL LOW (ref 80.0–100.0)
Monocytes Absolute: 1.6 10*3/uL — ABNORMAL HIGH (ref 0.1–1.0)
Monocytes Absolute: 1.4 10*3/uL — ABNORMAL HIGH (ref 0.1–1.0)
Monocytes Relative: 8 %
Monocytes Relative: 7 %
Neutro Abs: 16.2 10*3/uL — ABNORMAL HIGH (ref 1.7–7.7)
Neutro Abs: 18.3 10*3/uL — ABNORMAL HIGH (ref 1.7–7.7)
Neutrophils Relative %: 77 %
Neutrophils Relative %: 83 %
Platelets: 528 10*3/uL — ABNORMAL HIGH (ref 150–400)
Platelets: 496 10*3/uL — ABNORMAL HIGH (ref 150–400)
RBC: 3.89 MIL/uL (ref 3.87–5.11)
RBC: 3.8 MIL/uL — ABNORMAL LOW (ref 3.87–5.11)
RDW: 15.2 % (ref 11.5–15.5)
RDW: 15.3 % (ref 11.5–15.5)
WBC: 20.7 10*3/uL — ABNORMAL HIGH (ref 4.0–10.5)
WBC: 21.7 10*3/uL — ABNORMAL HIGH (ref 4.0–10.5)
nRBC: 0 % (ref 0.0–0.2)
nRBC: 0 % (ref 0.0–0.2)

## 2022-11-27 LAB — BASIC METABOLIC PANEL
Anion gap: 11 (ref 5–15)
BUN: 7 mg/dL (ref 6–20)
CO2: 18 mmol/L — ABNORMAL LOW (ref 22–32)
Calcium: 7.9 mg/dL — ABNORMAL LOW (ref 8.9–10.3)
Chloride: 106 mmol/L (ref 98–111)
Creatinine, Ser: 0.58 mg/dL (ref 0.44–1.00)
GFR, Estimated: >60 mL/min (ref >60)
Glucose, Bld: 72 mg/dL (ref 70–99)
Potassium: 3.5 mmol/L (ref 3.5–5.1)
Sodium: 135 mmol/L (ref 135–145)

## 2022-11-27 LAB — COMPREHENSIVE METABOLIC PANEL
ALT: 14 U/L (ref 0–44)
AST: 14 U/L — ABNORMAL LOW (ref 15–41)
Albumin: 3.9 g/dL (ref 3.5–5.0)
Alkaline Phosphatase: 58 U/L (ref 38–126)
Anion gap: 14 (ref 5–15)
BUN: 9 mg/dL (ref 6–20)
CO2: 18 mmol/L — ABNORMAL LOW (ref 22–32)
Calcium: 9 mg/dL (ref 8.9–10.3)
Chloride: 103 mmol/L (ref 98–111)
Creatinine, Ser: 0.74 mg/dL (ref 0.44–1.00)
GFR, Estimated: >60 mL/min (ref >60)
Glucose, Bld: 101 mg/dL — ABNORMAL HIGH (ref 70–99)
Potassium: 3 mmol/L — ABNORMAL LOW (ref 3.5–5.1)
Sodium: 135 mmol/L (ref 135–145)
Total Bilirubin: 0.6 mg/dL (ref 0.3–1.2)
Total Protein: 6.7 g/dL (ref 6.5–8.1)

## 2022-11-27 LAB — I-STAT CHEM 8, ED
BUN: 6 mg/dL (ref 6–20)
Calcium, Ion: 1.24 mmol/L (ref 1.15–1.40)
Chloride: 103 mmol/L (ref 98–111)
Creatinine, Ser: 0.8 mg/dL (ref 0.44–1.00)
Glucose, Bld: 81 mg/dL (ref 70–99)
HCT: 30 % — ABNORMAL LOW (ref 36.0–46.0)
Hemoglobin: 10.2 g/dL — ABNORMAL LOW (ref 12.0–15.0)
Potassium: 2.9 mmol/L — ABNORMAL LOW (ref 3.5–5.1)
Sodium: 137 mmol/L (ref 135–145)
TCO2: 18 mmol/L — ABNORMAL LOW (ref 22–32)

## 2022-11-27 LAB — IRON AND TIBC
Iron: 17 ug/dL — ABNORMAL LOW (ref 28–170)
Saturation Ratios: 3 % — ABNORMAL LOW (ref 10.4–31.8)
TIBC: 573 ug/dL — ABNORMAL HIGH (ref 250–450)
UIBC: 556 ug/dL

## 2022-11-27 LAB — TSH: TSH: 1.126 u[IU]/mL (ref 0.350–4.500)

## 2022-11-27 LAB — URINALYSIS, ROUTINE W REFLEX MICROSCOPIC
Bacteria, UA: NONE SEEN
Bilirubin Urine: NEGATIVE
Glucose, UA: NEGATIVE mg/dL
Hgb urine dipstick: NEGATIVE
Ketones, ur: 20 mg/dL — AB
Nitrite: NEGATIVE
Protein, ur: NEGATIVE mg/dL
Specific Gravity, Urine: 1.043 — ABNORMAL HIGH (ref 1.005–1.030)
pH: 6 (ref 5.0–8.0)

## 2022-11-27 LAB — RETICULOCYTES
Immature Retic Fract: 28.3 % — ABNORMAL HIGH (ref 2.3–15.9)
RBC.: 4.11 MIL/uL (ref 3.87–5.11)
Retic Count, Absolute: 69.9 10*3/uL (ref 19.0–186.0)
Retic Ct Pct: 1.7 % (ref 0.4–3.1)

## 2022-11-27 LAB — C-REACTIVE PROTEIN: CRP: 6.2 mg/dL — ABNORMAL HIGH (ref <1.0)

## 2022-11-27 LAB — PROCALCITONIN: Procalcitonin: <0.1 ng/mL

## 2022-11-27 LAB — LACTIC ACID, PLASMA: Lactic Acid, Venous: 1.1 mmol/L (ref 0.5–1.9)

## 2022-11-27 LAB — FERRITIN: Ferritin: 7 ng/mL — ABNORMAL LOW (ref 11–307)

## 2022-11-27 LAB — VITAMIN B12: Vitamin B-12: 348 pg/mL (ref 180–914)

## 2022-11-27 LAB — LIPASE, BLOOD: Lipase: 28 U/L (ref 11–51)

## 2022-11-27 LAB — FOLATE: Folate: 11.2 ng/mL (ref >5.9)

## 2022-11-27 MED ORDER — MORPHINE SULFATE (PF) 4 MG/ML IV SOLN
4.0000 mg | Freq: Once | INTRAVENOUS | Status: AC
  Administered 2022-11-27: 4 mg via INTRAVENOUS
  Filled 2022-11-27: qty 1

## 2022-11-27 MED ORDER — SODIUM CHLORIDE 0.9 % IV SOLN
2.0000 g | INTRAVENOUS | Status: DC
  Administered 2022-11-28 – 2022-12-01 (×4): 2 g via INTRAVENOUS
  Filled 2022-11-27 (×4): qty 20

## 2022-11-27 MED ORDER — SODIUM CHLORIDE (PF) 0.9 % IJ SOLN
INTRAMUSCULAR | Status: AC
  Filled 2022-11-27: qty 50

## 2022-11-27 MED ORDER — POTASSIUM CHLORIDE 10 MEQ/100ML IV SOLN
10.0000 meq | INTRAVENOUS | Status: AC
  Administered 2022-11-27 (×3): 10 meq via INTRAVENOUS
  Filled 2022-11-27 (×3): qty 100

## 2022-11-27 MED ORDER — FENTANYL CITRATE PF 50 MCG/ML IJ SOSY
12.5000 ug | PREFILLED_SYRINGE | INTRAMUSCULAR | Status: DC | PRN
  Administered 2022-11-27 – 2022-11-28 (×5): 12.5 ug via INTRAVENOUS
  Filled 2022-11-27 (×5): qty 1

## 2022-11-27 MED ORDER — LACTATED RINGERS IV BOLUS
1000.0000 mL | Freq: Once | INTRAVENOUS | Status: AC
  Administered 2022-11-27: 1000 mL via INTRAVENOUS

## 2022-11-27 MED ORDER — METRONIDAZOLE 500 MG/100ML IV SOLN
500.0000 mg | Freq: Two times a day (BID) | INTRAVENOUS | Status: DC
  Administered 2022-11-27 – 2022-12-01 (×8): 500 mg via INTRAVENOUS
  Filled 2022-11-27 (×8): qty 100

## 2022-11-27 MED ORDER — IOHEXOL 300 MG/ML  SOLN
100.0000 mL | Freq: Once | INTRAMUSCULAR | Status: AC | PRN
  Administered 2022-11-27: 100 mL via INTRAVENOUS

## 2022-11-27 MED ORDER — ONDANSETRON HCL 4 MG/2ML IJ SOLN
4.0000 mg | Freq: Once | INTRAMUSCULAR | Status: AC
  Administered 2022-11-27: 4 mg via INTRAVENOUS
  Filled 2022-11-27: qty 2

## 2022-11-27 MED ORDER — KCL-LACTATED RINGERS 20 MEQ/L IV SOLN
INTRAVENOUS | Status: DC
  Filled 2022-11-27: qty 1000

## 2022-11-27 MED ORDER — SODIUM CHLORIDE 0.9 % IV SOLN
2.0000 g | Freq: Once | INTRAVENOUS | Status: AC
  Administered 2022-11-27: 2 g via INTRAVENOUS
  Filled 2022-11-27: qty 20

## 2022-11-27 MED ORDER — KETOROLAC TROMETHAMINE 15 MG/ML IJ SOLN
15.0000 mg | Freq: Once | INTRAMUSCULAR | Status: AC
  Administered 2022-11-27: 15 mg via INTRAVENOUS
  Filled 2022-11-27: qty 1

## 2022-11-27 MED ORDER — ALBUTEROL SULFATE HFA 108 (90 BASE) MCG/ACT IN AERS: 1.0000 | INHALATION_SPRAY | Freq: Four times a day (QID) | RESPIRATORY_TRACT | Status: DC | PRN

## 2022-11-27 MED ORDER — PROMETHAZINE (PHENERGAN) 6.25MG IN NS 50ML IVPB
6.2500 mg | Freq: Four times a day (QID) | INTRAVENOUS | Status: DC | PRN
  Administered 2022-11-27: 6.25 mg via INTRAVENOUS
  Filled 2022-11-27: qty 6.25

## 2022-11-27 MED ORDER — METRONIDAZOLE 500 MG/100ML IV SOLN
500.0000 mg | Freq: Once | INTRAVENOUS | Status: AC
  Administered 2022-11-27: 500 mg via INTRAVENOUS
  Filled 2022-11-27: qty 100

## 2022-11-27 MED ORDER — HYDROMORPHONE HCL 1 MG/ML IJ SOLN
1.0000 mg | Freq: Once | INTRAMUSCULAR | Status: AC
  Administered 2022-11-27: 1 mg via INTRAVENOUS
  Filled 2022-11-27: qty 1

## 2022-11-27 MED ORDER — PANTOPRAZOLE SODIUM 40 MG IV SOLR
40.0000 mg | INTRAVENOUS | Status: DC
  Administered 2022-11-27: 40 mg via INTRAVENOUS
  Filled 2022-11-27: qty 10

## 2022-11-27 MED ORDER — KETOROLAC TROMETHAMINE 15 MG/ML IJ SOLN
7.5000 mg | Freq: Four times a day (QID) | INTRAMUSCULAR | Status: DC | PRN
  Administered 2022-11-27 – 2022-11-28 (×3): 7.5 mg via INTRAVENOUS
  Filled 2022-11-27 (×3): qty 1

## 2022-11-27 MED ORDER — SODIUM CHLORIDE 0.9% FLUSH
3.0000 mL | Freq: Two times a day (BID) | INTRAVENOUS | Status: DC
  Administered 2022-11-27 – 2022-12-01 (×6): 3 mL via INTRAVENOUS

## 2022-11-27 MED ORDER — POTASSIUM CHLORIDE 2 MEQ/ML IV SOLN
INTRAVENOUS | Status: DC
  Filled 2022-11-27 (×5): qty 1000

## 2022-11-27 MED ORDER — ONDANSETRON HCL 4 MG/2ML IJ SOLN
4.0000 mg | Freq: Four times a day (QID) | INTRAMUSCULAR | Status: DC
  Administered 2022-11-27 – 2022-11-28 (×4): 4 mg via INTRAVENOUS
  Filled 2022-11-27 (×5): qty 2

## 2022-11-27 MED ORDER — ENOXAPARIN SODIUM 40 MG/0.4ML IJ SOSY
40.0000 mg | PREFILLED_SYRINGE | Freq: Every day | INTRAMUSCULAR | Status: DC
  Administered 2022-11-27 – 2022-11-30 (×4): 40 mg via SUBCUTANEOUS
  Filled 2022-11-27 (×4): qty 0.4

## 2022-11-27 MED ORDER — ALBUTEROL SULFATE (2.5 MG/3ML) 0.083% IN NEBU: 2.5000 mg | INHALATION_SOLUTION | Freq: Four times a day (QID) | RESPIRATORY_TRACT | Status: DC | PRN

## 2022-11-27 MED ORDER — HYDRALAZINE HCL 20 MG/ML IJ SOLN
10.0000 mg | Freq: Once | INTRAMUSCULAR | Status: AC
  Administered 2022-11-27: 10 mg via INTRAVENOUS
  Filled 2022-11-27: qty 1

## 2022-11-27 NOTE — ED Notes (Signed)
ED TO INPATIENT HANDOFF REPORT  Name/Age/Gender Cheryl Delacruz 42 y.o. female  Code Status   Home/SNF/Other Home  Chief Complaint Acute colitis [K52.9]  Level of Care/Admitting Diagnosis ED Disposition     ED Disposition  Admit   Condition  --   Comment  Hospital Area: [facility] [[id number]]  Level of Care: Med-Surg [16]  May place patient in observation at [facility] or [facility] if equivalent level of care is available:: Yes  Covid Evaluation: Confirmed COVID Negative  Diagnosis: Acute colitis [914782]  Admitting Physician: Glade Lloyd [[id number]]  Attending Physician: Glade Lloyd [[id number]]          Medical History Past Medical History:  Diagnosis Date   Asthma    Kidney stone     Allergies Allergies  Allergen Reactions   Corn-Containing Products Hives and Swelling   Erythromycin Hives   Other Hives    Peas    Peanuts [Peanut Oil] Hives and Swelling    IV Location/Drains/Wounds Patient Lines/Drains/Airways Status     Active Line/Drains/Airways     Name Placement date Placement time Site Days   Peripheral IV 11/27/22 20 G Left Antecubital 11/27/22  0851  Antecubital  less than 1            Labs/Imaging Results for orders placed or performed during the hospital encounter of 11/27/22 (from the past 48 hour(s))  Lipase, blood     Status: None   Collection Time: 11/27/22  8:37 AM  Result Value Ref Range   Lipase 28 11 - 51 U/L    Comment: Performed at [facility], [address] [place], [place] [address]  Comprehensive metabolic panel     Status: Abnormal   Collection Time: 11/27/22  8:37 AM  Result Value Ref Range   Sodium 135 135 - 145 mmol/L   Potassium 3.0 (L) 3.5 - 5.1 mmol/L   Chloride 103 98 - 111 mmol/L   CO2 18 (L) 22 - 32 mmol/L   Glucose, Bld 101 (H) 70 - 99 mg/dL    Comment: Glucose reference range applies only to samples taken after fasting for at least 8 hours.   BUN 9 6 - 20  mg/dL   Creatinine, Ser 2.84 0.44 - 1.00 mg/dL   Calcium 9.0 8.9 - 13.2 mg/dL   Total Protein 6.7 6.5 - 8.1 g/dL   Albumin 3.9 3.5 - 5.0 g/dL   AST 14 (L) 15 - 41 U/L   ALT 14 0 - 44 U/L   Alkaline Phosphatase 58 38 - 126 U/L   Total Bilirubin 0.6 0.3 - 1.2 mg/dL   GFR, Estimated >44 >01 mL/min    Comment: (NOTE) Calculated using the CKD-EPI Creatinine Equation (2021)    Anion gap 14 5 - 15    Comment: Performed at [facility], [address] [place], [place] [address]  CBC with Differential/Platelet     Status: Abnormal   Collection Time: 11/27/22  8:37 AM  Result Value Ref Range   WBC 20.7 (H) 4.0 - 10.5 K/uL   RBC 3.89 3.87 - 5.11 MIL/uL   Hemoglobin 8.6 (L) 12.0 - 15.0 g/dL    Comment: Reticulocyte Hemoglobin testing may be clinically indicated, consider ordering this additional test [id number]    HCT 27.7 (L) 36.0 - 46.0 %   MCV 71.2 (L) 80.0 - 100.0 fL   MCH 22.1 (L) 26.0 - 34.0 pg   MCHC 31.0 30.0 - 36.0 g/dL  RDW 15.2 11.5 - 15.5 %   Platelets 528 (H) 150 - 400 K/uL   nRBC 0.0 0.0 - 0.2 %   Neutrophils Relative % 77 %   Neutro Abs 16.2 (H) 1.7 - 7.7 K/uL   Lymphocytes Relative 11 %   Lymphs Abs 2.2 0.7 - 4.0 K/uL   Monocytes Relative 8 %   Monocytes Absolute 1.6 (H) 0.1 - 1.0 K/uL   Eosinophils Relative 2 %   Eosinophils Absolute 0.4 0.0 - 0.5 K/uL   Basophils Relative 1 %   Basophils Absolute 0.1 0.0 - 0.1 K/uL   Immature Granulocytes 1 %   Abs Immature Granulocytes 0.11 (H) 0.00 - 0.07 K/uL    Comment: Performed at [facility], [address], [place], [place] [address]  I-stat chem 8, ed     Status: Abnormal   Collection Time: 11/27/22 10:37 AM  Result Value Ref Range   Sodium 137 135 - 145 mmol/L   Potassium 2.9 (L) 3.5 - 5.1 mmol/L   Chloride 103 98 - 111 mmol/L   BUN 6 6 - 20 mg/dL   Creatinine, Ser 4.69 0.44 - 1.00 mg/dL   Glucose, Bld 81 70 - 99 mg/dL    Comment: Glucose reference range applies only to  samples taken after fasting for at least 8 hours.   Calcium, Ion 1.24 1.15 - 1.40 mmol/L   TCO2 18 (L) 22 - 32 mmol/L   Hemoglobin 10.2 (L) 12.0 - 15.0 g/dL   HCT 62.9 (L) 52.8 - 41.3 %  Urinalysis, Routine w reflex microscopic -Urine, Clean Catch     Status: Abnormal   Collection Time: 11/27/22 12:06 PM  Result Value Ref Range   Color, Urine STRAW (A) YELLOW   APPearance CLOUDY (A) CLEAR   Specific Gravity, Urine 1.043 (H) 1.005 - 1.030   pH 6.0 5.0 - 8.0   Glucose, UA NEGATIVE NEGATIVE mg/dL   Hgb urine dipstick NEGATIVE NEGATIVE   Bilirubin Urine NEGATIVE NEGATIVE   Ketones, ur 20 (A) NEGATIVE mg/dL   Protein, ur NEGATIVE NEGATIVE mg/dL   Nitrite NEGATIVE NEGATIVE   Leukocytes,Ua TRACE (A) NEGATIVE   RBC / HPF 0-5 0 - 5 RBC/hpf   WBC, UA 0-5 0 - 5 WBC/hpf   Bacteria, UA NONE SEEN NONE SEEN   Squamous Epithelial / HPF 11-20 0 - 5 /HPF    Comment: Performed at [facility], [address], [place], [place] [address]   CT ABDOMEN PELVIS W CONTRAST  Result Date: 11/27/2022 CLINICAL DATA:  Right lower quadrant abdominal pain with nausea. EXAM: CT ABDOMEN AND PELVIS WITH CONTRAST TECHNIQUE: Multidetector CT imaging of the abdomen and pelvis was performed using the standard protocol following bolus administration of intravenous contrast. RADIATION DOSE REDUCTION: This exam was performed according to the departmental dose-optimization program which includes automated exposure control, adjustment of the mA and/or kV according to patient size and/or use of iterative reconstruction technique. CONTRAST:  OMNIPAQUE IOHEXOL 300 MG/ML  SOLN COMPARISON:  Ultrasound and CT 03/13/2022. Older CT examinations as well FINDINGS: Lower chest: There is some linear opacities along bases likely scar or atelectasis. No pleural effusion. Small hiatal hernia. Hepatobiliary: No focal liver abnormality is seen. No gallstones, gallbladder wall thickening, or biliary dilatation. Stable mild  ectasia of the common duct but normal tapering towards the pancreatic head. Pancreas: Unremarkable. No pancreatic ductal dilatation or surrounding inflammatory changes. Spleen: Normal in size without focal abnormality. Adrenals/Urinary Tract: Adrenal glands are unremarkable. Kidneys  are normal, without focal lesion, or hydronephrosis. Bladder is unremarkable. Punctate nonobstructing right-sided renal stone. Stomach/Bowel: Stomach is underdistended with some luminal fluid. Small bowel is nondilated. Large bowel has a normal course and caliber with scattered colonic stool. Normal appendix. There is however some subtle wall thickening along the right side of the colon near the hepatic flexure with stranding, best seen on coronal images such as image 56 of series 8. Please correlate for a subtle colitis. Vascular/Lymphatic: No significant vascular findings are present. No enlarged abdominal or pelvic lymph nodes. Reproductive: Multilobular uterus consistent with multiple fibroids. There is also a cystic structure in the left adnexa measuring 2.6 cm which is likely ovarian. By CT this appears relatively simple. No follow-up imaging recommended. Note: This recommendation does not apply to premenarchal patients and to those with increased risk (genetic, family history, elevated tumor markers or other high-risk factors) of ovarian cancer. Reference: JACR 2020 Feb; 17(2):248-254 Other: Trace free fluid in the pelvis. No free air. Epigastric midline anterior abdominal wall hernia with rectus muscle diastasis and involvement of the transverse colon. This a wide mouth hernia. This also is not seen on the prior exam. Musculoskeletal: Slight degenerative changes along the spine. IMPRESSION: Mild wall thickening and stranding along the hepatic flexure of the colon. Please correlate for a subtle colitis. No obstruction, free air.  Normal appendix. New epigastric midline anterior abdominal wall hernia involving a portion of the  transverse colon and mesenteric fat. No obstruction. This a wide mouth hernia. Uterine fibroids. Punctate nonobstructing right-sided renal stone. Electronically Signed   By: Karen Kays M.D.   On: 11/27/2022 10:06    Pending Labs Unresulted Labs (From admission, onward)     Start     Ordered   11/27/22 1600  Basic metabolic panel  Once,   STAT        11/27/22 1323   11/27/22 1333  Urine Culture (for pregnant, neutropenic or urologic patients or patients with an indwelling urinary catheter)  (Urine Labs)  Add-on,   AD       Question:  Indication  Answer:  Flank Pain   11/27/22 1332   11/27/22 1333  Gastrointestinal Panel by PCR , Stool  (Gastrointestinal Panel by PCR, Stool                                                                                                                                                     **Does Not include CLOSTRIDIUM DIFFICILE testing. **If CDIFF testing is needed, place order from the "C Difficile Testing" order set.**)  Once,   R        11/27/22 1332   11/27/22 1333  C Difficile Quick Screen w PCR reflex  (C Difficile quick screen w PCR reflex panel )  Once, for 24 hours,   TIMED  References:    CDiff Information Tool   11/27/22 1332   11/27/22 1324  Lactic acid, plasma  STAT Now then every 3 hours,   R (with STAT occurrences)      11/27/22 1323   11/27/22 1323  Culture, blood (Routine X 2) w Reflex to ID Panel  BLOOD CULTURE X 2,   R (with STAT occurrences)      11/27/22 1322   11/27/22 1323  Procalcitonin  Once,   URGENT       References:    Procalcitonin Lower Respiratory Tract Infection AND Sepsis Procalcitonin Algorithm   11/27/22 1322   11/27/22 1323  C-reactive protein  Once,   URGENT        11/27/22 1322            Vitals/Pain Today's Vitals   11/27/22 1057 11/27/22 1233 11/27/22 1240 11/27/22 1300  BP: (!) 200/122 (!) 175/106 (!) 175/106 (!) 148/108  Pulse: 90 95  (!) 101  Resp: 18 18    Temp: 98 F (36.7 C) (!) 97.5 F (36.4 C)     TempSrc: Oral Oral    SpO2: 96% 100%  100%  Weight:      Height:      PainSc:        Isolation Precautions Enteric precautions (UV disinfection)  Medications Medications  potassium chloride 10 mEq in 100 mL IVPB (10 mEq Intravenous New Bag/Given 11/27/22 1323)  promethazine (PHENERGAN) 6.25 mg/NS 50 mL IVPB (0 mg Intravenous Stopped 11/27/22 1300)  metroNIDAZOLE (FLAGYL) IVPB 500 mg (500 mg Intravenous New Bag/Given 11/27/22 1400)  ondansetron (ZOFRAN) injection 4 mg (has no administration in time range)  lactated ringers with KCl 20 mEq/L infusion (has no administration in time range)  ondansetron (ZOFRAN) injection 4 mg (4 mg Intravenous Given 11/27/22 0851)  lactated ringers bolus 1,000 mL (0 mLs Intravenous Stopped 11/27/22 0951)  morphine (PF) 4 MG/ML injection 4 mg (4 mg Intravenous Given 11/27/22 0851)  HYDROmorphone (DILAUDID) injection 1 mg (1 mg Intravenous Given 11/27/22 0920)  ketorolac (TORADOL) 15 MG/ML injection 15 mg (15 mg Intravenous Given 11/27/22 0920)  iohexol (OMNIPAQUE) 300 MG/ML solution 100 mL (100 mLs Intravenous Contrast Given 11/27/22 0940)  ondansetron (ZOFRAN) injection 4 mg (4 mg Intravenous Given 11/27/22 1051)  cefTRIAXone (ROCEPHIN) 2 g in sodium chloride 0.9 % 100 mL IVPB (0 g Intravenous Stopped 11/27/22 1354)  hydrALAZINE (APRESOLINE) injection 10 mg (10 mg Intravenous Given 11/27/22 1240)    Mobility walks

## 2022-11-27 NOTE — Consult Note (Signed)
CT imaging reviewed. Suspect abdominal pain is secondary to colitis and unrelated to ventral hernia. May follow up electively for discussion of VHR.   Diamantina Monks, MD General and Trauma Surgery [facility] Surgery

## 2022-11-27 NOTE — ED Provider Notes (Addendum)
[place] EMERGENCY DEPARTMENT AT [facility] Provider Note   CSN: [id number] Arrival date & time: 11/27/22  0800     History  No chief complaint on file.   Cheryl Delacruz is a 42 y.o. female.  42 year old female with no significant past medical history presents today for evaluation of right lower quadrant abdominal pain.  She states this started last night and felt Lajoyce Corners like a pulled muscle however intensified through the night until it became maximal intensity this morning.  Associated with some nausea but no vomiting.  Denies dysuria.  Endorses chills but no definite fever.  History of myomectomy in August.  States she has incisional hernia on the left side.  The history is provided by the patient. No language interpreter was used.       Home Medications Prior to Admission medications   Medication Sig Start Date End Date Taking? Authorizing Provider  albuterol (PROVENTIL HFA;VENTOLIN HFA) 108 (90 Base) MCG/ACT inhaler Inhale 1-2 puffs into the lungs every 6 (six) hours as needed for wheezing or shortness of breath. 08/30/16   Everlene Farrier, PA-C  clonazePAM (KLONOPIN) 0.5 MG tablet Take 0.5 mg by mouth daily as needed for anxiety. 03/25/15   [provider]  HYDROcodone-acetaminophen (NORCO/VICODIN) 5-325 MG tablet Take 1 tablet by mouth every 6 (six) hours as needed for severe pain. 08/29/21   Zadie Rhine, MD  ipratropium-albuterol (DUONEB) 0.5-2.5 (3) MG/3ML SOLN Take 3 mLs by nebulization every 6 (six) hours as needed (for shortness of breath). 05/13/21   [provider]  loratadine (CLARITIN) 10 MG tablet Take 10 mg by mouth daily.    [provider]  ondansetron (ZOFRAN-ODT) 8 MG disintegrating tablet Take 1 tablet (8 mg total) by mouth every 8 (eight) hours as needed for nausea or vomiting. 03/14/22   Dione Booze, MD  oxyCODONE-acetaminophen (PERCOCET) 5-325 MG tablet Take 1 tablet by mouth every 4 (four) hours as needed. 09/04/21    Garlon Hatchet, PA-C  pantoprazole (PROTONIX) 40 MG tablet Take 1 tablet (40 mg total) by mouth daily. 03/14/22   Dione Booze, MD  potassium chloride SA (KLOR-CON M) 20 MEQ tablet Take 1 tablet (20 mEq total) by mouth 2 (two) times daily. 03/14/22   Dione Booze, MD      Allergies    Corn-containing products, Erythromycin, Other, and Peanuts [peanut oil]    Review of Systems   Review of Systems  Constitutional:  Negative for chills and fever.  Cardiovascular:  Negative for chest pain.  Gastrointestinal:  Positive for abdominal pain and nausea. Negative for vomiting.  Genitourinary:  Negative for dysuria and flank pain.  Neurological:  Negative for light-headedness.  All other systems reviewed and are negative.   Physical Exam Updated Vital Signs BP (!) 188/109 (BP Location: Right Arm)   Pulse (!) 107   Temp 98.2 F (36.8 C) (Oral)   Resp 18   Ht 5\' 4"  (1.626 m)   Wt 72.6 kg   LMP 11/19/2022   SpO2 100%   BMI 27.47 kg/m  Physical Exam Vitals and nursing note reviewed.  Constitutional:      General: She is not in acute distress.    Appearance: Normal appearance. She is not ill-appearing.  HENT:     Head: Normocephalic and atraumatic.     Nose: Nose normal.  Eyes:     Conjunctiva/sclera: Conjunctivae normal.  Cardiovascular:     Rate and Rhythm: Normal rate and regular rhythm.     Heart  sounds: Normal heart sounds.  Pulmonary:     Effort: Pulmonary effort is normal. No respiratory distress.     Breath sounds: No wheezing.  Abdominal:     General: There is no distension.     Palpations: Abdomen is soft.     Tenderness: There is abdominal tenderness. There is guarding. There is no right CVA tenderness or left CVA tenderness.  Musculoskeletal:        General: No deformity or signs of injury. Normal range of motion.     Cervical back: Normal range of motion.  Skin:    Findings: No rash.  Neurological:     Mental Status: She is alert.     ED Results / Procedures  / Treatments   Labs (all labs ordered are listed, but only abnormal results are displayed) Labs Reviewed  URINALYSIS, ROUTINE W REFLEX MICROSCOPIC  LIPASE, BLOOD  COMPREHENSIVE METABOLIC PANEL  CBC WITH DIFFERENTIAL/PLATELET  I-STAT CHEM 8, ED    EKG None  Radiology No results found.  Procedures Procedures    Medications Ordered in ED Medications  ondansetron (ZOFRAN) injection 4 mg (has no administration in time range)  lactated ringers bolus 1,000 mL (has no administration in time range)  morphine (PF) 4 MG/ML injection 4 mg (has no administration in time range)    ED Course/ Medical Decision Making/ A&P                             Medical Decision Making Amount and/or Complexity of Data Reviewed Labs: ordered. Radiology: ordered.  Risk Prescription drug management. Decision regarding hospitalization.   Medical Decision Making / ED Course   This patient presents to the ED for concern of abdominal pain, this involves an extensive number of treatment options, and is a complaint that carries with it a high risk of complications and morbidity.  The differential diagnosis includes appendicitis, pyelonephritis, nephrolithiasis, PID, cholecystitis, colitis, UTI  MDM: 42 year old female presents today for evaluation of right lower quadrant abdominal pain since last night that has progressively worsened.  Afebrile.  History of myomectomy otherwise no history of abdominal surgeries.  Will obtain labs, provide fluids and pain control, and obtain CT abdomen pelvis.  CBC shows leukocytosis of 20.7 with left shift.  Hemoglobin of 8.6 which is slightly below recent from 8 months ago which was around mid 9.  CMP with potassium 3.0.  IV repletion ordered.  Renal function preserved.  UA without evidence of UTI.  CT abdomen pelvis demonstrates evidence of colitis near the hepatic flexure.  Also ventral hernia noted with bowel present within the hernia.  It is a widemouth.  She does  have some periumbilical tenderness.  Last bowel movement was yesterday.  Still passing flatus.  Discussed with general surgery.  They will review scans and let us know if they feel if the hernia explain the role in patient's symptoms.  Given most of the tenderness to palpation is in the right upper quadrant this is likely related to the colitis.  Will start patient on Rocephin and Flagyl.  Will discuss with hospitalist for admission for intractable symptoms.  Discussed with hospitalist will evaluate patient for admission.  General surgery reached out to me after patient was admitted.  I reviewed the scans and do not recommend any surgical intervention at this time as the hernia is widemouth and they feel that the symptoms are most likely related to the colitis.  Lab Tests: -I ordered,  reviewed, and interpreted labs.   The pertinent results include:   Labs Reviewed  URINALYSIS, ROUTINE W REFLEX MICROSCOPIC  LIPASE, BLOOD  COMPREHENSIVE METABOLIC PANEL  CBC WITH DIFFERENTIAL/PLATELET  I-STAT CHEM 8, ED      EKG  EKG Interpretation Date/Time:    Ventricular Rate:    PR Interval:    QRS Duration:    QT Interval:    QTC Calculation:   R Axis:      Text Interpretation:           Imaging Studies ordered: I ordered imaging studies including ct abdomen pelvis with contrast I independently visualized and interpreted imaging. I agree with the radiologist interpretation   Medicines ordered and prescription drug management: Meds ordered this encounter  Medications   ondansetron (ZOFRAN) injection 4 mg   lactated ringers bolus 1,000 mL   morphine (PF) 4 MG/ML injection 4 mg    -I have reviewed the patients home medicines and have made adjustments as needed  Consultations Obtained: I requested consultation with the general surgery ,  and discussed lab and imaging findings as well as pertinent plan - they recommend: as above  Reevaluation: After the interventions noted above, I  reevaluated the patient and found that they have :improved  Co morbidities that complicate the patient evaluation  Past Medical History:  Diagnosis Date   Asthma    Kidney stone       Dispostion: Patient discussed with hospitalist will evaluate patient for admission for intractable symptoms.   Final Clinical Impression(s) / ED Diagnoses Final diagnoses:  Colitis  Ventral hernia without obstruction or gangrene    Rx / DC Orders ED Discharge Orders     None         Marita Kansas, PA-C 11/27/22 1325    [address] PA-C 11/27/22 1347    Derwood Kaplan, MD 11/27/22 1409

## 2022-11-27 NOTE — ED Triage Notes (Signed)
Pt arrived POV reports RLQ pain with nausea. Denies any other symptoms.

## 2022-11-27 NOTE — H&P (Signed)
History and Physical    Patient: Cheryl Delacruz ZOX:[id number] DOB: [birth year] DOA: 11/27/2022 DOS: the patient was seen and examined on 11/27/2022 PCP: Verlon Au, MD  Patient coming from: Home  Chief Complaint:  Chief Complaint  Patient presents with   Abdominal Pain   HPI: Cheryl Delacruz is a 42 y.o. female with medical history significant of asthma and recent myomectomy of multiple uterine fibroids February 2024.  Patient states she was in her usual state of health until about 3 days ago when she developed decreased appetite.  Yesterday on Thursday she developed abrupt right lower quadrant abdominal pain initially accompanied by nausea but after receiving IV Dilaudid in the treatment area developed emesis.  She has not had any diarrhea, no blood emesis or per rectum.  Is not had any sick contacts.  Because of her symptoms she presented to the ED.  In the ER she was afebrile, normal to hypertensive likely related to underlying pain.  Room air sats were within normal limits and she had intermittent tachycardia in the context of pain.  Her white count was elevated at 20,700.  Her initial hemoglobin was 8.6.  With microcytosis noting MCV was 71.2.  Potassium was low at 3.0.  She had a nonelevated anion gap acidosis with a CO2 of 18.  ET of the abdomen and pelvis revealed mild wall thickening and stranding along the hepatic flexure of the colon.  She also had a new epigastric midline anterior abdominal wall hernia involving a portion of the transverse colon mesenteric fat but the opening was widemouth and no obstruction or strangulation noted.  In addition she had an abnormal urinalysis with an elevated specific gravity.  Service has been asked to evaluate the patient for admission.   Review of Systems: As mentioned in the history of present illness. All other systems reviewed and are negative. Past Medical History:  Diagnosis Date   Asthma    Kidney stone    History reviewed. No  pertinent surgical history. Social History:  reports that she has been smoking cigarettes. She has been smoking an average of .5 packs per day. She has never used smokeless tobacco. She reports current alcohol use. She reports that she does not use drugs.  Allergies  Allergen Reactions   Corn-Containing Products Hives and Swelling   Erythromycin Hives   Other Hives    Peas    Peanuts [Peanut Oil] Hives and Swelling    Family History  Problem Relation Age of Onset   Diabetes Mother    Heart disease Mother    Hyperlipidemia Father    Diabetes Maternal Grandmother    Heart disease Maternal Grandmother    Diabetes Paternal Grandfather    Heart disease Paternal Grandfather    Hyperlipidemia Paternal Grandfather     Prior to Admission medications   Medication Sig Start Date End Date Taking? Authorizing Provider  albuterol (PROVENTIL HFA;VENTOLIN HFA) 108 (90 Base) MCG/ACT inhaler Inhale 1-2 puffs into the lungs every 6 (six) hours as needed for wheezing or shortness of breath. 08/30/16   Everlene Farrier, PA-C  clonazePAM (KLONOPIN) 0.5 MG tablet Take 0.5 mg by mouth daily as needed for anxiety. 03/25/15   [provider]  HYDROcodone-acetaminophen (NORCO/VICODIN) 5-325 MG tablet Take 1 tablet by mouth every 6 (six) hours as needed for severe pain. 08/29/21   Zadie Rhine, MD  ipratropium-albuterol (DUONEB) 0.5-2.5 (3) MG/3ML SOLN Take 3 mLs by nebulization every 6 (six) hours as needed (for shortness of breath). 05/13/21  [provider]  loratadine (CLARITIN) 10 MG tablet Take 10 mg by mouth daily.    [provider]  ondansetron (ZOFRAN-ODT) 8 MG disintegrating tablet Take 1 tablet (8 mg total) by mouth every 8 (eight) hours as needed for nausea or vomiting. 03/14/22   Dione Booze, MD  oxyCODONE-acetaminophen (PERCOCET) 5-325 MG tablet Take 1 tablet by mouth every 4 (four) hours as needed. 09/04/21   Garlon Hatchet, PA-C  pantoprazole (PROTONIX) 40 MG tablet  Take 1 tablet (40 mg total) by mouth daily. 03/14/22   Dione Booze, MD  potassium chloride SA (KLOR-CON M) 20 MEQ tablet Take 1 tablet (20 mEq total) by mouth 2 (two) times daily. 03/14/22   Dione Booze, MD    Physical Exam: Vitals:   11/27/22 1233 11/27/22 1240 11/27/22 1300 11/27/22 1426  BP: (!) 175/106 (!) 175/106 (!) 148/108 (!) 161/102  Pulse: 95  (!) 101 (!) 111  Resp: 18   18  Temp: (!) 97.5 F (36.4 C)   97.9 F (36.6 C)  TempSrc: Oral   Oral  SpO2: 100%  100% 99%  Weight:      Height:       Constitutional: NAD, somewhat anxious secondary to ongoing right lower quadrant abdominal pain that is worse with movement and activity. Respiratory: clear to auscultation bilaterally, no wheezing, no crackles. Normal respiratory effort. No accessory muscle use.  Able on room air Cardiovascular: Regular rate and rhythm, no murmurs / rubs / gallops. No extremity edema. 2+ pedal pulses. No carotid bruits.  Abdomen: Exquisitely tender in the right lower quadrant with guarding but no rebounding, no masses palpated. No hepatosplenomegaly. Bowel sounds positive are very hypoactive.  Very small abdominal wall defect to the left of the old midline incision.  This area is soft and is easily reducible with palpation. Musculoskeletal: no clubbing / cyanosis. No joint deformity upper and lower extremities. Good ROM, no contractures. Normal muscle tone.  Skin: no rashes, lesions, ulcers. No induration Neurologic: CN 2-12 grossly intact. Sensation intact, Strength 5/5 x all 4 extremities.  Psychiatric: Normal judgment and insight. Alert and oriented x 3. Normal mood.     Data Reviewed:  Initial sodium 135, potassium 3.0, chloride 103, CO2 18, glucose 101, BUN 9, creatinine 0.74, LFTs are normal.  Follow-up electrolyte panel essentially unchanged although potassium had decreased slightly to 2.9.  CBC as described above  Abnormal urinalysis with cloudy appearance, straw color, 20 ketones, trace  leukocytes, specific gravity 1.043, no bacteria, squamous epithelials 11-20 with 0 WBCs.  Assessment and Plan: Acute hepatic flexure colitis associated right lower quadrant pain Etiology unclear but suspect virus.  Check GI pathogen panel No diarrhea but as precaution we will check C. difficile toxin/PCR Patient intolerant to IV Dilaudid due to nausea so we will change to low-dose fentanyl IV Scheduled Zofran with as needed Phenergan for nausea Continue symptom management No diarrhea but may need to involve GI to rule out early presentation of Crohn's or ulcerative colitis Continue IV Rocephin and IV Flagyl  Dehydration with associated hypokalemia and nonelevated anion gap acidemia Lactated Ringer's with potassium at 125 cc/h Follow labs Suspect electrolyte abnormality secondary to GI losses  Microcytic anemia Has known history of uterine fibroids and I suspect she has heavy uterine bleeding Baseline hemoglobin has been trending downward from 13 to 12-10 and initial hemoglobin at presentation was 8.7 with a follow-up point-of-care just above 10 Given presentation with colitis will check fecal occult blood Anemia panel and TSH  Abnormal urinalysis/leukocytosis Appears more consistent with dehydration White count likely related to dehydration noting procalcitonin <0.10.  Lactic acid normal.  CRP pending Motion will obtain urine culture and blood cultures  Chronic intrinsic asthma Continue home albuterol MDI prn   Advance Care Planning:   Code Status: Full Code   VTE prophylaxis: Lovenox  Consults: None  Family Communication: Patient only  Severity of Illness: The appropriate patient status for this patient is OBSERVATION. Observation status is judged to be reasonable and necessary in order to provide the required intensity of service to ensure the patient's safety. The patient's presenting symptoms, physical exam findings, and initial radiographic and laboratory data in the  context of their medical condition is felt to place them at decreased risk for further clinical deterioration. Furthermore, it is anticipated that the patient will be medically stable for discharge from the hospital within 2 midnights of admission.   Author: Junious Silk, NP 11/27/2022 2:57 PM  For on call review [email or web address].

## 2022-11-27 NOTE — ED Notes (Signed)
ED TO INPATIENT HANDOFF REPORT  Name/Age/Gender Cheryl Delacruz 42 y.o. female  Code Status   Home/SNF/Other Home  Chief Complaint Acute colitis [K52.9]  Level of Care/Admitting Diagnosis ED Disposition     ED Disposition  Admit   Condition  --   Comment  Hospital Area: [facility] [[id number]]  Level of Care: Med-Surg [16]  May place patient in observation at [facility] or [facility] if equivalent level of care is available:: Yes  Covid Evaluation: Confirmed COVID Negative  Diagnosis: Acute colitis [409811]  Admitting Physician: Glade Lloyd [[id number]]  Attending Physician: Glade Lloyd [[id number]]          Medical History Past Medical History:  Diagnosis Date   Asthma    Kidney stone     Allergies Allergies  Allergen Reactions   Corn-Containing Products Hives and Swelling   Erythromycin Hives   Other Hives    Peas    Peanuts [Peanut Oil] Hives and Swelling    IV Location/Drains/Wounds Patient Lines/Drains/Airways Status     Active Line/Drains/Airways     Name Placement date Placement time Site Days   Peripheral IV 11/27/22 20 G Left Antecubital 11/27/22  0851  Antecubital  less than 1            Labs/Imaging Results for orders placed or performed during the hospital encounter of 11/27/22 (from the past 48 hour(s))  Lipase, blood     Status: None   Collection Time: 11/27/22  8:37 AM  Result Value Ref Range   Lipase 28 11 - 51 U/L    Comment: Performed at [facility], [address] [place], [place] [address]  Comprehensive metabolic panel     Status: Abnormal   Collection Time: 11/27/22  8:37 AM  Result Value Ref Range   Sodium 135 135 - 145 mmol/L   Potassium 3.0 (L) 3.5 - 5.1 mmol/L   Chloride 103 98 - 111 mmol/L   CO2 18 (L) 22 - 32 mmol/L   Glucose, Bld 101 (H) 70 - 99 mg/dL    Comment: Glucose reference range applies only to samples taken after fasting for at least 8 hours.   BUN 9 6 - 20  mg/dL   Creatinine, Ser 6.96 0.44 - 1.00 mg/dL   Calcium 9.0 8.9 - 29.5 mg/dL   Total Protein 6.7 6.5 - 8.1 g/dL   Albumin 3.9 3.5 - 5.0 g/dL   AST 14 (L) 15 - 41 U/L   ALT 14 0 - 44 U/L   Alkaline Phosphatase 58 38 - 126 U/L   Total Bilirubin 0.6 0.3 - 1.2 mg/dL   GFR, Estimated >28 >41 mL/min    Comment: (NOTE) Calculated using the CKD-EPI Creatinine Equation (2021)    Anion gap 14 5 - 15    Comment: Performed at [facility], [address] [place], [place] [address]  CBC with Differential/Platelet     Status: Abnormal   Collection Time: 11/27/22  8:37 AM  Result Value Ref Range   WBC 20.7 (H) 4.0 - 10.5 K/uL   RBC 3.89 3.87 - 5.11 MIL/uL   Hemoglobin 8.6 (L) 12.0 - 15.0 g/dL    Comment: Reticulocyte Hemoglobin testing may be clinically indicated, consider ordering this additional test [id number]    HCT 27.7 (L) 36.0 - 46.0 %   MCV 71.2 (L) 80.0 - 100.0 fL   MCH 22.1 (L) 26.0 - 34.0 pg   MCHC 31.0 30.0 - 36.0 g/dL  RDW 15.2 11.5 - 15.5 %   Platelets 528 (H) 150 - 400 K/uL   nRBC 0.0 0.0 - 0.2 %   Neutrophils Relative % 77 %   Neutro Abs 16.2 (H) 1.7 - 7.7 K/uL   Lymphocytes Relative 11 %   Lymphs Abs 2.2 0.7 - 4.0 K/uL   Monocytes Relative 8 %   Monocytes Absolute 1.6 (H) 0.1 - 1.0 K/uL   Eosinophils Relative 2 %   Eosinophils Absolute 0.4 0.0 - 0.5 K/uL   Basophils Relative 1 %   Basophils Absolute 0.1 0.0 - 0.1 K/uL   Immature Granulocytes 1 %   Abs Immature Granulocytes 0.11 (H) 0.00 - 0.07 K/uL    Comment: Performed at [facility], [address] [place], [place] [address]  I-stat chem 8, ed     Status: Abnormal   Collection Time: 11/27/22 10:37 AM  Result Value Ref Range   Sodium 137 135 - 145 mmol/L   Potassium 2.9 (L) 3.5 - 5.1 mmol/L   Chloride 103 98 - 111 mmol/L   BUN 6 6 - 20 mg/dL   Creatinine, Ser 6.04 0.44 - 1.00 mg/dL   Glucose, Bld 81 70 - 99 mg/dL    Comment: Glucose reference range applies only to  samples taken after fasting for at least 8 hours.   Calcium, Ion 1.24 1.15 - 1.40 mmol/L   TCO2 18 (L) 22 - 32 mmol/L   Hemoglobin 10.2 (L) 12.0 - 15.0 g/dL   HCT 54.0 (L) 98.1 - 19.1 %  Urinalysis, Routine w reflex microscopic -Urine, Clean Catch     Status: Abnormal   Collection Time: 11/27/22 12:06 PM  Result Value Ref Range   Color, Urine STRAW (A) YELLOW   APPearance CLOUDY (A) CLEAR   Specific Gravity, Urine 1.043 (H) 1.005 - 1.030   pH 6.0 5.0 - 8.0   Glucose, UA NEGATIVE NEGATIVE mg/dL   Hgb urine dipstick NEGATIVE NEGATIVE   Bilirubin Urine NEGATIVE NEGATIVE   Ketones, ur 20 (A) NEGATIVE mg/dL   Protein, ur NEGATIVE NEGATIVE mg/dL   Nitrite NEGATIVE NEGATIVE   Leukocytes,Ua TRACE (A) NEGATIVE   RBC / HPF 0-5 0 - 5 RBC/hpf   WBC, UA 0-5 0 - 5 WBC/hpf   Bacteria, UA NONE SEEN NONE SEEN   Squamous Epithelial / HPF 11-20 0 - 5 /HPF    Comment: Performed at [facility], [address], [place], [place] [address]   CT ABDOMEN PELVIS W CONTRAST  Result Date: 11/27/2022 CLINICAL DATA:  Right lower quadrant abdominal pain with nausea. EXAM: CT ABDOMEN AND PELVIS WITH CONTRAST TECHNIQUE: Multidetector CT imaging of the abdomen and pelvis was performed using the standard protocol following bolus administration of intravenous contrast. RADIATION DOSE REDUCTION: This exam was performed according to the departmental dose-optimization program which includes automated exposure control, adjustment of the mA and/or kV according to patient size and/or use of iterative reconstruction technique. CONTRAST:  OMNIPAQUE IOHEXOL 300 MG/ML  SOLN COMPARISON:  Ultrasound and CT 03/13/2022. Older CT examinations as well FINDINGS: Lower chest: There is some linear opacities along bases likely scar or atelectasis. No pleural effusion. Small hiatal hernia. Hepatobiliary: No focal liver abnormality is seen. No gallstones, gallbladder wall thickening, or biliary dilatation. Stable mild  ectasia of the common duct but normal tapering towards the pancreatic head. Pancreas: Unremarkable. No pancreatic ductal dilatation or surrounding inflammatory changes. Spleen: Normal in size without focal abnormality. Adrenals/Urinary Tract: Adrenal glands are unremarkable. Kidneys  are normal, without focal lesion, or hydronephrosis. Bladder is unremarkable. Punctate nonobstructing right-sided renal stone. Stomach/Bowel: Stomach is underdistended with some luminal fluid. Small bowel is nondilated. Large bowel has a normal course and caliber with scattered colonic stool. Normal appendix. There is however some subtle wall thickening along the right side of the colon near the hepatic flexure with stranding, best seen on coronal images such as image 56 of series 8. Please correlate for a subtle colitis. Vascular/Lymphatic: No significant vascular findings are present. No enlarged abdominal or pelvic lymph nodes. Reproductive: Multilobular uterus consistent with multiple fibroids. There is also a cystic structure in the left adnexa measuring 2.6 cm which is likely ovarian. By CT this appears relatively simple. No follow-up imaging recommended. Note: This recommendation does not apply to premenarchal patients and to those with increased risk (genetic, family history, elevated tumor markers or other high-risk factors) of ovarian cancer. Reference: JACR 2020 Feb; 17(2):248-254 Other: Trace free fluid in the pelvis. No free air. Epigastric midline anterior abdominal wall hernia with rectus muscle diastasis and involvement of the transverse colon. This a wide mouth hernia. This also is not seen on the prior exam. Musculoskeletal: Slight degenerative changes along the spine. IMPRESSION: Mild wall thickening and stranding along the hepatic flexure of the colon. Please correlate for a subtle colitis. No obstruction, free air.  Normal appendix. New epigastric midline anterior abdominal wall hernia involving a portion of the  transverse colon and mesenteric fat. No obstruction. This a wide mouth hernia. Uterine fibroids. Punctate nonobstructing right-sided renal stone. Electronically Signed   By: Karen Kays M.D.   On: 11/27/2022 10:06    Pending Labs Unresulted Labs (From admission, onward)     Start     Ordered   11/27/22 1600  Basic metabolic panel  Once,   STAT        11/27/22 1323   11/27/22 1333  Urine Culture (for pregnant, neutropenic or urologic patients or patients with an indwelling urinary catheter)  (Urine Labs)  Add-on,   AD       Question:  Indication  Answer:  Flank Pain   11/27/22 1332   11/27/22 1333  Gastrointestinal Panel by PCR , Stool  (Gastrointestinal Panel by PCR, Stool                                                                                                                                                     **Does Not include CLOSTRIDIUM DIFFICILE testing. **If CDIFF testing is needed, place order from the "C Difficile Testing" order set.**)  Once,   R        11/27/22 1332   11/27/22 1333  C Difficile Quick Screen w PCR reflex  (C Difficile quick screen w PCR reflex panel )  Once, for 24 hours,   TIMED  References:    CDiff Information Tool   11/27/22 1332   11/27/22 1324  Lactic acid, plasma  STAT Now then every 3 hours,   R (with STAT occurrences)      11/27/22 1323   11/27/22 1323  Culture, blood (Routine X 2) w Reflex to ID Panel  BLOOD CULTURE X 2,   R (with STAT occurrences)      11/27/22 1322   11/27/22 1323  Procalcitonin  Once,   URGENT       References:    Procalcitonin Lower Respiratory Tract Infection AND Sepsis Procalcitonin Algorithm   11/27/22 1322   11/27/22 1323  C-reactive protein  Once,   URGENT        11/27/22 1322            Vitals/Pain Today's Vitals   11/27/22 1056 11/27/22 1057 11/27/22 1233 11/27/22 1240  BP:  (!) 200/122 (!) 175/106 (!) 175/106  Pulse:  90 95   Resp:  18 18   Temp: 98 F (36.7 C) 98 F (36.7 C) (!) 97.5 F (36.4 C)    TempSrc: Oral Oral Oral   SpO2:  96% 100%   Weight:      Height:      PainSc:        Isolation Precautions Enteric precautions (UV disinfection)  Medications Medications  potassium chloride 10 mEq in 100 mL IVPB (10 mEq Intravenous New Bag/Given 11/27/22 1323)  promethazine (PHENERGAN) 6.25 mg/NS 50 mL IVPB (0 mg Intravenous Stopped 11/27/22 1300)  metroNIDAZOLE (FLAGYL) IVPB 500 mg (has no administration in time range)  cefTRIAXone (ROCEPHIN) 2 g in sodium chloride 0.9 % 100 mL IVPB (2 g Intravenous New Bag/Given 11/27/22 1324)  ondansetron (ZOFRAN) injection 4 mg (has no administration in time range)  lactated ringers with KCl 20 mEq/L infusion (has no administration in time range)  ondansetron (ZOFRAN) injection 4 mg (4 mg Intravenous Given 11/27/22 0851)  lactated ringers bolus 1,000 mL (0 mLs Intravenous Stopped 11/27/22 0951)  morphine (PF) 4 MG/ML injection 4 mg (4 mg Intravenous Given 11/27/22 0851)  HYDROmorphone (DILAUDID) injection 1 mg (1 mg Intravenous Given 11/27/22 0920)  ketorolac (TORADOL) 15 MG/ML injection 15 mg (15 mg Intravenous Given 11/27/22 0920)  iohexol (OMNIPAQUE) 300 MG/ML solution 100 mL (100 mLs Intravenous Contrast Given 11/27/22 0940)  ondansetron (ZOFRAN) injection 4 mg (4 mg Intravenous Given 11/27/22 1051)  hydrALAZINE (APRESOLINE) injection 10 mg (10 mg Intravenous Given 11/27/22 1240)    Mobility walks

## 2022-11-28 DIAGNOSIS — E162 Hypoglycemia, unspecified: Secondary | ICD-10-CM | POA: Diagnosis not present

## 2022-11-28 DIAGNOSIS — E8721 Acute metabolic acidosis: Secondary | ICD-10-CM | POA: Diagnosis present

## 2022-11-28 DIAGNOSIS — R8290 Unspecified abnormal findings in urine: Secondary | ICD-10-CM | POA: Diagnosis present

## 2022-11-28 DIAGNOSIS — I10 Essential (primary) hypertension: Secondary | ICD-10-CM | POA: Diagnosis present

## 2022-11-28 DIAGNOSIS — Z87442 Personal history of urinary calculi: Secondary | ICD-10-CM | POA: Diagnosis not present

## 2022-11-28 DIAGNOSIS — K529 Noninfective gastroenteritis and colitis, unspecified: Secondary | ICD-10-CM | POA: Diagnosis present

## 2022-11-28 DIAGNOSIS — Z91010 Allergy to peanuts: Secondary | ICD-10-CM | POA: Diagnosis not present

## 2022-11-28 DIAGNOSIS — Z833 Family history of diabetes mellitus: Secondary | ICD-10-CM | POA: Diagnosis not present

## 2022-11-28 DIAGNOSIS — D509 Iron deficiency anemia, unspecified: Secondary | ICD-10-CM | POA: Diagnosis present

## 2022-11-28 DIAGNOSIS — Z8249 Family history of ischemic heart disease and other diseases of the circulatory system: Secondary | ICD-10-CM | POA: Diagnosis not present

## 2022-11-28 DIAGNOSIS — Z881 Allergy status to other antibiotic agents status: Secondary | ICD-10-CM | POA: Diagnosis not present

## 2022-11-28 DIAGNOSIS — Z91018 Allergy to other foods: Secondary | ICD-10-CM | POA: Diagnosis not present

## 2022-11-28 DIAGNOSIS — F17210 Nicotine dependence, cigarettes, uncomplicated: Secondary | ICD-10-CM | POA: Diagnosis present

## 2022-11-28 DIAGNOSIS — E876 Hypokalemia: Secondary | ICD-10-CM | POA: Diagnosis present

## 2022-11-28 DIAGNOSIS — R1031 Right lower quadrant pain: Secondary | ICD-10-CM | POA: Diagnosis present

## 2022-11-28 DIAGNOSIS — J45909 Unspecified asthma, uncomplicated: Secondary | ICD-10-CM | POA: Diagnosis present

## 2022-11-28 DIAGNOSIS — Z79899 Other long term (current) drug therapy: Secondary | ICD-10-CM | POA: Diagnosis not present

## 2022-11-28 DIAGNOSIS — E860 Dehydration: Secondary | ICD-10-CM | POA: Diagnosis present

## 2022-11-28 DIAGNOSIS — E871 Hypo-osmolality and hyponatremia: Secondary | ICD-10-CM | POA: Diagnosis present

## 2022-11-28 DIAGNOSIS — K432 Incisional hernia without obstruction or gangrene: Secondary | ICD-10-CM | POA: Diagnosis present

## 2022-11-28 DIAGNOSIS — R000 Tachycardia, unspecified: Secondary | ICD-10-CM | POA: Diagnosis present

## 2022-11-28 DIAGNOSIS — D75839 Thrombocytosis, unspecified: Secondary | ICD-10-CM | POA: Diagnosis present

## 2022-11-28 LAB — COMPREHENSIVE METABOLIC PANEL
ALT: 11 U/L (ref 0–44)
AST: 12 U/L — ABNORMAL LOW (ref 15–41)
Albumin: 3.2 g/dL — ABNORMAL LOW (ref 3.5–5.0)
Alkaline Phosphatase: 59 U/L (ref 38–126)
Anion gap: 11 (ref 5–15)
BUN: 6 mg/dL (ref 6–20)
CO2: 18 mmol/L — ABNORMAL LOW (ref 22–32)
Calcium: 8 mg/dL — ABNORMAL LOW (ref 8.9–10.3)
Chloride: 104 mmol/L (ref 98–111)
Creatinine, Ser: 0.7 mg/dL (ref 0.44–1.00)
GFR, Estimated: >60 mL/min (ref >60)
Glucose, Bld: 61 mg/dL — ABNORMAL LOW (ref 70–99)
Potassium: 3.1 mmol/L — ABNORMAL LOW (ref 3.5–5.1)
Sodium: 133 mmol/L — ABNORMAL LOW (ref 135–145)
Total Bilirubin: 0.5 mg/dL (ref 0.3–1.2)
Total Protein: 5.9 g/dL — ABNORMAL LOW (ref 6.5–8.1)

## 2022-11-28 LAB — CULTURE, BLOOD (ROUTINE X 2): Culture: NO GROWTH

## 2022-11-28 LAB — CBC
HCT: 26 % — ABNORMAL LOW (ref 36.0–46.0)
Hemoglobin: 7.8 g/dL — ABNORMAL LOW (ref 12.0–15.0)
MCH: 22.1 pg — ABNORMAL LOW (ref 26.0–34.0)
MCHC: 30 g/dL (ref 30.0–36.0)
MCV: 73.7 fL — ABNORMAL LOW (ref 80.0–100.0)
Platelets: 489 10*3/uL — ABNORMAL HIGH (ref 150–400)
RBC: 3.53 MIL/uL — ABNORMAL LOW (ref 3.87–5.11)
RDW: 15.3 % (ref 11.5–15.5)
WBC: 20.2 10*3/uL — ABNORMAL HIGH (ref 4.0–10.5)
nRBC: 0 % (ref 0.0–0.2)

## 2022-11-28 LAB — C DIFFICILE QUICK SCREEN W PCR REFLEX
C Diff antigen: NEGATIVE
C Diff interpretation: NOT DETECTED
C Diff toxin: NEGATIVE

## 2022-11-28 LAB — URINE CULTURE: Culture: 20000 — AB

## 2022-11-28 LAB — HIV ANTIBODY (ROUTINE TESTING W REFLEX): HIV Screen 4th Generation wRfx: NONREACTIVE

## 2022-11-28 MED ORDER — ONDANSETRON HCL 4 MG/2ML IJ SOLN
4.0000 mg | Freq: Four times a day (QID) | INTRAMUSCULAR | Status: DC | PRN
  Administered 2022-11-28 – 2022-12-01 (×5): 4 mg via INTRAVENOUS
  Filled 2022-11-28 (×5): qty 2

## 2022-11-28 MED ORDER — PANTOPRAZOLE SODIUM 40 MG IV SOLR
40.0000 mg | Freq: Two times a day (BID) | INTRAVENOUS | Status: DC
  Administered 2022-11-28 – 2022-12-01 (×6): 40 mg via INTRAVENOUS
  Filled 2022-11-28 (×5): qty 10

## 2022-11-28 MED ORDER — FENTANYL CITRATE PF 50 MCG/ML IJ SOSY
25.0000 ug | PREFILLED_SYRINGE | Freq: Once | INTRAMUSCULAR | Status: AC
  Administered 2022-11-28: 25 ug via INTRAVENOUS

## 2022-11-28 MED ORDER — POTASSIUM CHLORIDE CRYS ER 20 MEQ PO TBCR
40.0000 meq | EXTENDED_RELEASE_TABLET | Freq: Once | ORAL | Status: AC
  Administered 2022-11-28: 40 meq via ORAL
  Filled 2022-11-28: qty 2

## 2022-11-28 MED ORDER — SODIUM CHLORIDE 0.9 % IV SOLN
250.0000 mg | Freq: Once | INTRAVENOUS | Status: AC
  Administered 2022-11-28: 250 mg via INTRAVENOUS
  Filled 2022-11-28: qty 20

## 2022-11-28 MED ORDER — PROMETHAZINE (PHENERGAN) 6.25MG IN NS 50ML IVPB: 6.2500 mg | Freq: Four times a day (QID) | INTRAVENOUS | Status: DC | PRN

## 2022-11-28 MED ORDER — KETOROLAC TROMETHAMINE 15 MG/ML IJ SOLN
15.0000 mg | Freq: Four times a day (QID) | INTRAMUSCULAR | Status: DC | PRN
  Administered 2022-11-28 – 2022-11-29 (×3): 15 mg via INTRAVENOUS
  Filled 2022-11-28 (×3): qty 1

## 2022-11-28 MED ORDER — KCL IN DEXTROSE-NACL 40-5-0.9 MEQ/L-%-% IV SOLN
INTRAVENOUS | Status: DC
  Filled 2022-11-28 (×6): qty 1000

## 2022-11-28 MED ORDER — FENTANYL CITRATE PF 50 MCG/ML IJ SOSY
25.0000 ug | PREFILLED_SYRINGE | INTRAMUSCULAR | Status: DC | PRN
  Administered 2022-11-28 – 2022-11-30 (×10): 25 ug via INTRAVENOUS
  Filled 2022-11-28 (×11): qty 1

## 2022-11-28 NOTE — Progress Notes (Signed)
Informed Anthoney Harada, NP of pt's BP being elevated. Informed her of medications given for pain. Orders received to give an additional dose of Fentanyl .

## 2022-11-28 NOTE — Progress Notes (Signed)
Informed Cheryl Delacruz of pt's current BP reading. She asked to be informed of systolic BP >160 or diastolic >161.

## 2022-11-28 NOTE — Progress Notes (Signed)
PROGRESS NOTE    Cheryl Delacruz  UJW:[id number] DOB: Jul 05, 1980 DOA: 11/27/2022 PCP: Verlon Au, MD   Brief Narrative:  42 y/o F with h/o asthma presented with worsening abdominal pain with nausea.  On presentation, she had leukocytosis and hypokalemia. CT abdomen and pelvis showed mild wall thickening and stranding along the hepatic flexure of the colon along with widemouth abdominal wall hernia.  She was started on IV fluids, broad-spectrum antibiotics and IV analgesics as needed.  General surgery reviewed the CAT scan and recommended outpatient follow-up.  Assessment & Plan:   Severe abdominal pain Possible acute colitis Leukocytosis -Currently on Rocephin and Flagyl.  Still has significant leukocytosis with WBCs of 20.2 today.  Cultures negative so far.  No diarrhea since admission.  Stool studies pending. -Patient is still severely tender in the right lower quadrant.  I have asked general surgery to weigh in if there is need for any surgical intervention.  Currently on clear liquid diet.  Will not advance diet to general surgery evaluation.  Continue IV fluids.  Continue as needed analgesics and antiemetics. -Increase Protonix to IV every 12 hours.  Hyponatremia -Mild.  Monitor.  Continue IV fluids  Hypokalemia -Replace.  Repeat a.m. labs.  Microcytic anemia Iron deficiency anemia -Iron of 17 and iron saturation of 3.  Will give IV iron.  Will need supplemental iron on discharge. -Hemoglobin on the lower side but stable.  No signs of bleeding  Thrombocytopenia -Possibly reactive.  Monitor intermittently  Hypoglycemia -Switch IV fluids to D5 NS.  Chronic congestive asthma -Currently stable.  Continue as needed albuterol as needed.  On room air.   DVT prophylaxis: Lovenox Code Status: Full Family Communication: Husband at bedside Disposition Plan: Status is: Observation The patient will require care spanning > 2 midnights and should be moved to inpatient  because: Of severity of illness.  Need for IV fluids and antibiotics.  Consultants: Consult general surgery  Procedures: None  Antimicrobials: Rocephin and Zithromax from 11/27/2022 onwards   Subjective: Patient seen and examined at bedside.  Feels slightly better but still complains of right lower abdominal pain.  Currently not nauseous.  No fever reported.  Has not had a bowel movement since admission.  Objective: Vitals:   11/27/22 1943 11/28/22 0142 11/28/22 0500 11/28/22 1038  BP: (!) 175/109 (!) 169/113 (!) 182/111 (!) 189/112  Pulse: (!) 106 (!) 105 (!) 106 99  Resp: 18 20 18 20   Temp: 98.2 F (36.8 C) 98.2 F (36.8 C) 98.6 F (37 C) 98.5 F (36.9 C)  TempSrc: Oral Oral Oral Oral  SpO2: 99%   99%  Weight:      Height:        Intake/Output Summary (Last 24 hours) at 11/28/2022 1041 Last data filed at 11/28/2022 0600 Gross per 24 hour  Intake 1694.94 ml  Output --  Net 1694.94 ml   Filed Weights   11/27/22 0813  Weight: 72.6 kg    Examination:  General exam: Appears calm and comfortable.  On room air.  No distress. Respiratory system: Bilateral decreased breath sounds at bases, no wheezing Cardiovascular system: S1 & S2 heard, tachycardic intermittently  gastrointestinal system: Abdomen is nondistended, soft and still significantly tender in the right lower quadrant.. Normal bowel sounds heard. Extremities: No cyanosis, clubbing, edema  Central nervous system: Alert and oriented. No focal neurological deficits. Moving extremities Skin: No rashes, lesions or ulcers Psychiatry: Judgement and insight appear normal. Mood & affect appropriate.     Data  Reviewed: I have personally reviewed following labs and imaging studies  CBC: Recent Labs  Lab 11/27/22 0837 11/27/22 1037 11/27/22 1541 11/28/22 0533  WBC 20.7*  --  21.7* 20.2*  NEUTROABS 16.2*  --  18.3*  --   HGB 8.6* 10.2* 8.3* 7.8*  HCT 27.7* 30.0* 28.4* 26.0*  MCV 71.2*  --  74.7* 73.7*  PLT 528*   --  496* 489*   Basic Metabolic Panel: Recent Labs  Lab 11/27/22 0837 11/27/22 1037 11/27/22 1541 11/28/22 0533  NA 135 137 135 133*  K 3.0* 2.9* 3.5 3.1*  CL 103 103 106 104  CO2 18*  --  18* 18*  GLUCOSE 101* 81 72 61*  BUN 9 6 7 6   CREATININE 0.74 0.80 0.58 0.70  CALCIUM 9.0  --  7.9* 8.0*   GFR: Estimated Creatinine Clearance: 91.3 mL/min (by C-G formula based on SCr of 0.7 mg/dL). Liver Function Tests: Recent Labs  Lab 11/27/22 0837 11/28/22 0533  AST 14* 12*  ALT 14 11  ALKPHOS 58 59  BILITOT 0.6 0.5  PROT 6.7 5.9*  ALBUMIN 3.9 3.2*   Recent Labs  Lab 11/27/22 0837  LIPASE 28   No results for input(s): "AMMONIA" in the last 168 hours. Coagulation Profile: No results for input(s): "INR", "PROTIME" in the last 168 hours. Cardiac Enzymes: No results for input(s): "CKTOTAL", "CKMB", "CKMBINDEX", "TROPONINI" in the last 168 hours. BNP (last 3 results) No results for input(s): "PROBNP" in the last 8760 hours. HbA1C: No results for input(s): "HGBA1C" in the last 72 hours. CBG: No results for input(s): "GLUCAP" in the last 168 hours. Lipid Profile: No results for input(s): "CHOL", "HDL", "LDLCALC", "TRIG", "CHOLHDL", "LDLDIRECT" in the last 72 hours. Thyroid Function Tests: Recent Labs    11/27/22 1623  TSH 1.126   Anemia Panel: Recent Labs    11/27/22 1623  VITAMINB12 348  FOLATE 11.2  FERRITIN 7*  TIBC 573*  IRON 17*  RETICCTPCT 1.7   Sepsis Labs: Recent Labs  Lab 11/27/22 1341  PROCALCITON <0.10  LATICACIDVEN 1.1    Recent Results (from the past 240 hour(s))  Culture, blood (Routine X 2) w Reflex to ID Panel     Status: None (Preliminary result)   Collection Time: 11/27/22  1:41 PM   Specimen: BLOOD  Result Value Ref Range Status   Specimen Description   Final    BLOOD RIGHT ANTECUBITAL Performed at [facility], [address] [place], [place] [address]    Special Requests   Final    BOTTLES DRAWN AEROBIC AND  ANAEROBIC Blood Culture adequate volume Performed at [facility], [address], [place], [place] [address]    Culture   Final    NO GROWTH < 24 HOURS Performed at [facility], [address] [place], [place] [address]    Report Status PENDING  Incomplete  Culture, blood (Routine X 2) w Reflex to ID Panel     Status: None (Preliminary result)   Collection Time: 11/27/22  1:58 PM   Specimen: BLOOD  Result Value Ref Range Status   Specimen Description   Final    BLOOD LEFT ANTECUBITAL Performed at [facility], [address], [place], [place] [address]    Special Requests   Final    BOTTLES DRAWN AEROBIC AND ANAEROBIC Blood Culture adequate volume Performed at [facility], [address], [place], [place] [address]    Culture   Final  NO GROWTH < 24 HOURS Performed at [facility], [address], [place], [place] [address]    Report Status PENDING  Incomplete         Radiology Studies: CT ABDOMEN PELVIS W CONTRAST  Result Date: 11/27/2022 CLINICAL DATA:  Right lower quadrant abdominal pain with nausea. EXAM: CT ABDOMEN AND PELVIS WITH CONTRAST TECHNIQUE: Multidetector CT imaging of the abdomen and pelvis was performed using the standard protocol following bolus administration of intravenous contrast. RADIATION DOSE REDUCTION: This exam was performed according to the departmental dose-optimization program which includes automated exposure control, adjustment of the mA and/or kV according to patient size and/or use of iterative reconstruction technique. CONTRAST:  OMNIPAQUE IOHEXOL 300 MG/ML  SOLN COMPARISON:  Ultrasound and CT 03/13/2022. Older CT examinations as well FINDINGS: Lower chest: There is some linear opacities along bases likely scar or atelectasis. No pleural effusion. Small hiatal hernia. Hepatobiliary: No focal liver abnormality is seen. No gallstones, gallbladder wall thickening, or  biliary dilatation. Stable mild ectasia of the common duct but normal tapering towards the pancreatic head. Pancreas: Unremarkable. No pancreatic ductal dilatation or surrounding inflammatory changes. Spleen: Normal in size without focal abnormality. Adrenals/Urinary Tract: Adrenal glands are unremarkable. Kidneys are normal, without focal lesion, or hydronephrosis. Bladder is unremarkable. Punctate nonobstructing right-sided renal stone. Stomach/Bowel: Stomach is underdistended with some luminal fluid. Small bowel is nondilated. Large bowel has a normal course and caliber with scattered colonic stool. Normal appendix. There is however some subtle wall thickening along the right side of the colon near the hepatic flexure with stranding, best seen on coronal images such as image 56 of series 8. Please correlate for a subtle colitis. Vascular/Lymphatic: No significant vascular findings are present. No enlarged abdominal or pelvic lymph nodes. Reproductive: Multilobular uterus consistent with multiple fibroids. There is also a cystic structure in the left adnexa measuring 2.6 cm which is likely ovarian. By CT this appears relatively simple. No follow-up imaging recommended. Note: This recommendation does not apply to premenarchal patients and to those with increased risk (genetic, family history, elevated tumor markers or other high-risk factors) of ovarian cancer. Reference: JACR 2020 Feb; 17(2):248-254 Other: Trace free fluid in the pelvis. No free air. Epigastric midline anterior abdominal wall hernia with rectus muscle diastasis and involvement of the transverse colon. This a wide mouth hernia. This also is not seen on the prior exam. Musculoskeletal: Slight degenerative changes along the spine. IMPRESSION: Mild wall thickening and stranding along the hepatic flexure of the colon. Please correlate for a subtle colitis. No obstruction, free air.  Normal appendix. New epigastric midline anterior abdominal wall hernia  involving a portion of the transverse colon and mesenteric fat. No obstruction. This a wide mouth hernia. Uterine fibroids. Punctate nonobstructing right-sided renal stone. Electronically Signed   By: Karen Kays M.D.   On: 11/27/2022 10:06        Scheduled Meds:  enoxaparin (LOVENOX) injection  40 mg Subcutaneous QHS   ondansetron (ZOFRAN) IV  4 mg Intravenous Q6H   pantoprazole (PROTONIX) IV  40 mg Intravenous Q24H   sodium chloride flush  3 mL Intravenous Q12H   Continuous Infusions:  cefTRIAXone (ROCEPHIN)  IV     lactated ringers 1,000 mL with potassium chloride 20 mEq infusion 125 mL/hr at 11/28/22 0540   metronidazole 500 mg (11/28/22 1019)   promethazine (PHENERGAN) injection (IM or IVPB) Stopped (11/27/22 1300)          Glade Lloyd, MD [facility] 11/28/2022, 10:41  AM

## 2022-11-28 NOTE — Consult Note (Signed)
Reason for Consult:Right-sided abdominal pain Referring Physician: [name] is an 42 y.o. female.  HPI: This is a 42 year old female with past medical history significant asthma and chronic anemia as well as a recent myomectomy for fibroids in February 2024 who presented with 3-day history of decreasing appetite, right-sided abdominal pain.  Initially she had some nausea currently has no symptoms of nausea.  No bowel movements in a couple days.  She continues to pass gas.  She was evaluated in the emergency department where she was noted to have an elevated white blood cell count.  She has chronic anemia.  She was hypokalemic.  CT scan of revealed thickening and stranding along the hepatic flexure of the right colon.  She has an incisional hernia in her midline in the epigastrium after her recent surgery.  Part of the transverse colon mentum seems to be in the hernia but no bowel involvement or obstruction.  Her appendix was visualized and is normal.  We are asked to consult on the patient for ongoing right-sided abdominal pain.  Her white blood cell count is decreased today.  Past Medical History:  Diagnosis Date   Asthma    Kidney stone    PSH -   Family History  Problem Relation Age of Onset   Diabetes Mother    Heart disease Mother    Hyperlipidemia Father    Diabetes Maternal Grandmother    Heart disease Maternal Grandmother    Diabetes Paternal Grandfather    Heart disease Paternal Grandfather    Hyperlipidemia Paternal Grandfather     Social History:  reports that she has been smoking cigarettes. She has been smoking an average of .5 packs per day. She has never used smokeless tobacco. She reports current alcohol use. She reports that she does not use drugs.  Allergies:  Allergies  Allergen Reactions   Corn-Containing Products Hives and Swelling    Pt states "I'm only allergic to corn on cob, Not foods or drinks containing corn syrup"   Erythromycin Hives    Other Hives    Peas    Peanuts [Peanut Oil] Hives and Swelling    Medications:  Prior to Admission medications   Medication Sig Start Date End Date Taking? Authorizing Provider  albuterol (PROVENTIL HFA;VENTOLIN HFA) 108 (90 Base) MCG/ACT inhaler Inhale 1-2 puffs into the lungs every 6 (six) hours as needed for wheezing or shortness of breath. 08/30/16  Yes Everlene Farrier, PA-C  clonazePAM (KLONOPIN) 0.5 MG tablet Take 0.5 mg by mouth daily as needed for anxiety. 03/25/15  Yes [provider]  indomethacin (INDOCIN SR) 75 MG CR capsule Take 75 mg by mouth 2 (two) times daily. 11/17/22 12/01/22 Yes [provider]  ipratropium-albuterol (DUONEB) 0.5-2.5 (3) MG/3ML SOLN Take 3 mLs by nebulization every 6 (six) hours as needed (for shortness of breath). 05/13/21  Yes [provider]  loratadine (CLARITIN) 10 MG tablet Take 10 mg by mouth daily.   Yes [provider]  potassium chloride SA (KLOR-CON M) 20 MEQ tablet Take 1 tablet (20 mEq total) by mouth 2 (two) times daily. 03/14/22  Yes Dione Booze, MD  tranexamic acid (LYSTEDA) 650 MG TABS tablet Take 1,300 mg by mouth 3 (three) times daily. 11/17/22  Yes [provider]  HYDROcodone-acetaminophen (NORCO/VICODIN) 5-325 MG tablet Take 1 tablet by mouth every 6 (six) hours as needed for severe pain. Patient not taking: Reported on 11/27/2022 08/29/21   Zadie Rhine, MD  ondansetron (ZOFRAN-ODT) 8 MG  disintegrating tablet Take 1 tablet (8 mg total) by mouth every 8 (eight) hours as needed for nausea or vomiting. Patient not taking: Reported on 11/27/2022 03/14/22   Dione Booze, MD  oxyCODONE-acetaminophen (PERCOCET) 5-325 MG tablet Take 1 tablet by mouth every 4 (four) hours as needed. Patient not taking: Reported on 11/27/2022 09/04/21   Garlon Hatchet, PA-C  pantoprazole (PROTONIX) 40 MG tablet Take 1 tablet (40 mg total) by mouth daily. Patient not taking: Reported on 11/27/2022 03/14/22   Dione Booze, MD      Results for orders placed or performed during the hospital encounter of 11/27/22 (from the past 48 hour(s))  Lipase, blood     Status: None   Collection Time: 11/27/22  8:37 AM  Result Value Ref Range   Lipase 28 11 - 51 U/L    Comment: Performed at [facility], [address], [place], [place] [address]  Comprehensive metabolic panel     Status: Abnormal   Collection Time: 11/27/22  8:37 AM  Result Value Ref Range   Sodium 135 135 - 145 mmol/L   Potassium 3.0 (L) 3.5 - 5.1 mmol/L   Chloride 103 98 - 111 mmol/L   CO2 18 (L) 22 - 32 mmol/L   Glucose, Bld 101 (H) 70 - 99 mg/dL    Comment: Glucose reference range applies only to samples taken after fasting for at least 8 hours.   BUN 9 6 - 20 mg/dL   Creatinine, Ser 3.08 0.44 - 1.00 mg/dL   Calcium 9.0 8.9 - 65.7 mg/dL   Total Protein 6.7 6.5 - 8.1 g/dL   Albumin 3.9 3.5 - 5.0 g/dL   AST 14 (L) 15 - 41 U/L   ALT 14 0 - 44 U/L   Alkaline Phosphatase 58 38 - 126 U/L   Total Bilirubin 0.6 0.3 - 1.2 mg/dL   GFR, Estimated >84 >69 mL/min    Comment: (NOTE) Calculated using the CKD-EPI Creatinine Equation (2021)    Anion gap 14 5 - 15    Comment: Performed at [facility] [place] [facility], [address] [place], [place] [address]  CBC with Differential/Platelet     Status: Abnormal   Collection Time: 11/27/22  8:37 AM  Result Value Ref Range   WBC 20.7 (H) 4.0 - 10.5 K/uL   RBC 3.89 3.87 - 5.11 MIL/uL   Hemoglobin 8.6 (L) 12.0 - 15.0 g/dL    Comment: Reticulocyte Hemoglobin testing may be clinically indicated, consider ordering this additional test [id number]    HCT 27.7 (L) 36.0 - 46.0 %   MCV 71.2 (L) 80.0 - 100.0 fL   MCH 22.1 (L) 26.0 - 34.0 pg   MCHC 31.0 30.0 - 36.0 g/dL   RDW 10.2 72.5 - 36.6 %   Platelets 528 (H) 150 - 400 K/uL   nRBC 0.0 0.0 - 0.2 %   Neutrophils Relative % 77 %   Neutro Abs 16.2 (H) 1.7 - 7.7 K/uL   Lymphocytes Relative 11 %   Lymphs Abs 2.2 0.7 - 4.0 K/uL   Monocytes  Relative 8 %   Monocytes Absolute 1.6 (H) 0.1 - 1.0 K/uL   Eosinophils Relative 2 %   Eosinophils Absolute 0.4 0.0 - 0.5 K/uL   Basophils Relative 1 %   Basophils Absolute 0.1 0.0 - 0.1 K/uL   Immature Granulocytes 1 %   Abs Immature Granulocytes 0.11 (H) 0.00 - 0.07 K/uL    Comment: Performed at [facility], [address] [place], [place] [address]  I-stat chem 8, ed     Status: Abnormal   Collection Time: 11/27/22 10:37 AM  Result Value Ref Range   Sodium 137 135 - 145 mmol/L   Potassium 2.9 (L) 3.5 - 5.1 mmol/L   Chloride 103 98 - 111 mmol/L   BUN 6 6 - 20 mg/dL   Creatinine, Ser 7.82 0.44 - 1.00 mg/dL   Glucose, Bld 81 70 - 99 mg/dL    Comment: Glucose reference range applies only to samples taken after fasting for at least 8 hours.   Calcium, Ion 1.24 1.15 - 1.40 mmol/L   TCO2 18 (L) 22 - 32 mmol/L   Hemoglobin 10.2 (L) 12.0 - 15.0 g/dL   HCT 95.6 (L) 21.3 - 08.6 %  Urinalysis, Routine w reflex microscopic -Urine, Clean Catch     Status: Abnormal   Collection Time: 11/27/22 12:06 PM  Result Value Ref Range   Color, Urine STRAW (A) YELLOW   APPearance CLOUDY (A) CLEAR   Specific Gravity, Urine 1.043 (H) 1.005 - 1.030   pH 6.0 5.0 - 8.0   Glucose, UA NEGATIVE NEGATIVE mg/dL   Hgb urine dipstick NEGATIVE NEGATIVE   Bilirubin Urine NEGATIVE NEGATIVE   Ketones, ur 20 (A) NEGATIVE mg/dL   Protein, ur NEGATIVE NEGATIVE mg/dL   Nitrite NEGATIVE NEGATIVE   Leukocytes,Ua TRACE (A) NEGATIVE   RBC / HPF 0-5 0 - 5 RBC/hpf   WBC, UA 0-5 0 - 5 WBC/hpf   Bacteria, UA NONE SEEN NONE SEEN   Squamous Epithelial / HPF 11-20 0 - 5 /HPF    Comment: Performed at [facility], [address] [place], [place] [address]  Urine Culture (for pregnant, neutropenic or urologic patients or patients with an indwelling urinary catheter)     Status: Abnormal (Preliminary result)   Collection Time: 11/27/22 12:06 PM   Specimen: Urine, Clean Catch  Result Value  Ref Range   Specimen Description      URINE, CLEAN CATCH Performed at [facility], [address], [place], [place] [address]    Special Requests      NONE Performed at [facility], [address] [place], [place] [address]    Culture 20,000 COLONIES/mL ESCHERICHIA COLI (A)    Report Status PENDING   Culture, blood (Routine X 2) w Reflex to ID Panel     Status: None (Preliminary result)   Collection Time: 11/27/22  1:41 PM   Specimen: BLOOD  Result Value Ref Range   Specimen Description      BLOOD RIGHT ANTECUBITAL Performed at [facility], [address] [place], [place] [address]    Special Requests      BOTTLES DRAWN AEROBIC AND ANAEROBIC Blood Culture adequate volume Performed at [facility], [address] [place], [place] [address]    Culture      NO GROWTH < 24 HOURS Performed at [facility], [address], [place], [place] [address]    Report Status PENDING   Procalcitonin     Status: None   Collection Time: 11/27/22  1:41 PM  Result Value Ref Range   Procalcitonin <0.10 ng/mL    Comment:        Interpretation: PCT (Procalcitonin) <= 0.5 ng/mL: Systemic infection (sepsis) is not likely. Local bacterial infection is possible. (NOTE)       Sepsis PCT Algorithm  Lower Respiratory Tract                                      Infection PCT Algorithm    ----------------------------     ----------------------------         PCT < 0.25 ng/mL                PCT < 0.10 ng/mL          Strongly encourage             Strongly discourage   discontinuation of antibiotics    initiation of antibiotics    ----------------------------     -----------------------------       PCT 0.25 - 0.50 ng/mL            PCT 0.10 - 0.25 ng/mL               OR       >80% decrease in PCT            Discourage initiation of                                            antibiotics      Encourage  discontinuation           of antibiotics    ----------------------------     -----------------------------         PCT >= 0.50 ng/mL              PCT 0.26 - 0.50 ng/mL               AND        <80% decrease in PCT             Encourage initiation of                                             antibiotics       Encourage continuation           of antibiotics    ----------------------------     -----------------------------        PCT >= 0.50 ng/mL                  PCT > 0.50 ng/mL               AND         increase in PCT                  Strongly encourage                                      initiation of antibiotics    Strongly encourage escalation           of antibiotics                                     -----------------------------  PCT <= 0.25 ng/mL                                                 OR                                        > 80% decrease in PCT                                      Discontinue / Do not initiate                                             antibiotics  Performed at [facility], [address], [place], [place] [address]   C-reactive protein     Status: Abnormal   Collection Time: 11/27/22  1:41 PM  Result Value Ref Range   CRP 6.2 (H) <1.0 mg/dL    Comment: Performed at [facility], [address] [place], [place] [address]  Lactic acid, plasma     Status: None   Collection Time: 11/27/22  1:41 PM  Result Value Ref Range   Lactic Acid, Venous 1.1 0.5 - 1.9 mmol/L    Comment: Performed at [facility], [address] [place], [place] [address]  Culture, blood (Routine X 2) w Reflex to ID Panel     Status: None (Preliminary result)   Collection Time: 11/27/22  1:58 PM   Specimen: BLOOD  Result Value Ref Range   Specimen Description      BLOOD LEFT ANTECUBITAL Performed at [facility], [address] [place], [place] [address]     Special Requests      BOTTLES DRAWN AEROBIC AND ANAEROBIC Blood Culture adequate volume Performed at [facility], [address] [place], [place] [address]    Culture      NO GROWTH < 24 HOURS Performed at [facility], [address] [place], [place] [address]    Report Status PENDING   Basic metabolic panel     Status: Abnormal   Collection Time: 11/27/22  3:41 PM  Result Value Ref Range   Sodium 135 135 - 145 mmol/L   Potassium 3.5 3.5 - 5.1 mmol/L   Chloride 106 98 - 111 mmol/L   CO2 18 (L) 22 - 32 mmol/L   Glucose, Bld 72 70 - 99 mg/dL    Comment: Glucose reference range applies only to samples taken after fasting for at least 8 hours.   BUN 7 6 - 20 mg/dL   Creatinine, Ser 5.78 0.44 - 1.00 mg/dL   Calcium 7.9 (L) 8.9 - 10.3 mg/dL   GFR, Estimated >46 >96 mL/min    Comment: (NOTE) Calculated using the CKD-EPI Creatinine Equation (2021)    Anion gap 11 5 - 15    Comment: Performed at [facility], [address] [place], [place] [address]  CBC with Differential/Platelet     Status: Abnormal   Collection Time: 11/27/22  3:41 PM  Result Value Ref  Range   WBC 21.7 (H) 4.0 - 10.5 K/uL   RBC 3.80 (L) 3.87 - 5.11 MIL/uL   Hemoglobin 8.3 (L) 12.0 - 15.0 g/dL    Comment: Reticulocyte Hemoglobin testing may be clinically indicated, consider ordering this additional test [id number]    HCT 28.4 (L) 36.0 - 46.0 %   MCV 74.7 (L) 80.0 - 100.0 fL   MCH 21.8 (L) 26.0 - 34.0 pg   MCHC 29.2 (L) 30.0 - 36.0 g/dL   RDW 32.4 40.1 - 02.7 %   Platelets 496 (H) 150 - 400 K/uL   nRBC 0.0 0.0 - 0.2 %   Neutrophils Relative % 83 %   Neutro Abs 18.3 (H) 1.7 - 7.7 K/uL   Lymphocytes Relative 7 %   Lymphs Abs 1.5 0.7 - 4.0 K/uL   Monocytes Relative 7 %   Monocytes Absolute 1.4 (H) 0.1 - 1.0 K/uL   Eosinophils Relative 1 %   Eosinophils Absolute 0.1 0.0 - 0.5 K/uL   Basophils Relative 1 %   Basophils Absolute 0.1 0.0 - 0.1 K/uL   Immature  Granulocytes 1 %   Abs Immature Granulocytes 0.17 (H) 0.00 - 0.07 K/uL    Comment: Performed at [facility], [address], [place], [place] [address]  Vitamin B12     Status: None   Collection Time: 11/27/22  4:23 PM  Result Value Ref Range   Vitamin B-12 348 180 - 914 pg/mL    Comment: (NOTE) This assay is not validated for testing neonatal or myeloproliferative syndrome specimens for Vitamin B12 levels. Performed at [facility], [address] [place], [place] [address]   Folate     Status: None   Collection Time: 11/27/22  4:23 PM  Result Value Ref Range   Folate 11.2 >5.9 ng/mL    Comment: Performed at [facility], [address] [place], [place] [address]  Iron and TIBC     Status: Abnormal   Collection Time: 11/27/22  4:23 PM  Result Value Ref Range   Iron 17 (L) 28 - 170 ug/dL   TIBC 563 (H) 875 - 643 ug/dL   Saturation Ratios 3 (L) 10.4 - 31.8 %   UIBC 556 ug/dL    Comment: Performed at [facility], [address] [place], [place] [address]  Ferritin     Status: Abnormal   Collection Time: 11/27/22  4:23 PM  Result Value Ref Range   Ferritin 7 (L) 11 - 307 ng/mL    Comment: Performed at [facility], [address] [place], [place] [address]  Reticulocytes     Status: Abnormal   Collection Time: 11/27/22  4:23 PM  Result Value Ref Range   Retic Ct Pct 1.7 0.4 - 3.1 %   RBC. 4.11 3.87 - 5.11 MIL/uL   Retic Count, Absolute 69.9 19.0 - 186.0 K/uL   Immature Retic Fract 28.3 (H) 2.3 - 15.9 %    Comment: Performed at [facility], [address] [place], [place] [address]  TSH     Status: None   Collection Time: 11/27/22  4:23 PM  Result Value Ref Range   TSH 1.126 0.350 - 4.500 uIU/mL    Comment: Performed by a 3rd Generation assay with a functional sensitivity of <=0.01 uIU/mL. Performed at [facility], [address],  [place], [place] [address]   HIV Antibody (routine testing w rflx)     Status:  None   Collection Time: 11/28/22  5:33 AM  Result Value Ref Range   HIV Screen 4th Generation wRfx Non Reactive Non Reactive    Comment: Performed at [facility], [address], [place], [place] [address]  Comprehensive metabolic panel     Status: Abnormal   Collection Time: 11/28/22  5:33 AM  Result Value Ref Range   Sodium 133 (L) 135 - 145 mmol/L   Potassium 3.1 (L) 3.5 - 5.1 mmol/L   Chloride 104 98 - 111 mmol/L   CO2 18 (L) 22 - 32 mmol/L   Glucose, Bld 61 (L) 70 - 99 mg/dL    Comment: Glucose reference range applies only to samples taken after fasting for at least 8 hours.   BUN 6 6 - 20 mg/dL   Creatinine, Ser 6.04 0.44 - 1.00 mg/dL   Calcium 8.0 (L) 8.9 - 10.3 mg/dL   Total Protein 5.9 (L) 6.5 - 8.1 g/dL   Albumin 3.2 (L) 3.5 - 5.0 g/dL   AST 12 (L) 15 - 41 U/L   ALT 11 0 - 44 U/L   Alkaline Phosphatase 59 38 - 126 U/L   Total Bilirubin 0.5 0.3 - 1.2 mg/dL   GFR, Estimated >54 >09 mL/min    Comment: (NOTE) Calculated using the CKD-EPI Creatinine Equation (2021)    Anion gap 11 5 - 15    Comment: Performed at [facility], [address], [place], [place] [address]  CBC     Status: Abnormal   Collection Time: 11/28/22  5:33 AM  Result Value Ref Range   WBC 20.2 (H) 4.0 - 10.5 K/uL   RBC 3.53 (L) 3.87 - 5.11 MIL/uL   Hemoglobin 7.8 (L) 12.0 - 15.0 g/dL    Comment: Reticulocyte Hemoglobin testing may be clinically indicated, consider ordering this additional test [id number]    HCT 26.0 (L) 36.0 - 46.0 %   MCV 73.7 (L) 80.0 - 100.0 fL   MCH 22.1 (L) 26.0 - 34.0 pg   MCHC 30.0 30.0 - 36.0 g/dL   RDW 13.0 86.5 - 78.4 %   Platelets 489 (H) 150 - 400 K/uL   nRBC 0.0 0.0 - 0.2 %    Comment: Performed at [facility], [address], [place], [place] [address]    CT ABDOMEN PELVIS W CONTRAST  Result Date: 11/27/2022 CLINICAL DATA:  Right lower  quadrant abdominal pain with nausea. EXAM: CT ABDOMEN AND PELVIS WITH CONTRAST TECHNIQUE: Multidetector CT imaging of the abdomen and pelvis was performed using the standard protocol following bolus administration of intravenous contrast. RADIATION DOSE REDUCTION: This exam was performed according to the departmental dose-optimization program which includes automated exposure control, adjustment of the mA and/or kV according to patient size and/or use of iterative reconstruction technique. CONTRAST:  OMNIPAQUE IOHEXOL 300 MG/ML  SOLN COMPARISON:  Ultrasound and CT 03/13/2022. Older CT examinations as well FINDINGS: Lower chest: There is some linear opacities along bases likely scar or atelectasis. No pleural effusion. Small hiatal hernia. Hepatobiliary: No focal liver abnormality is seen. No gallstones, gallbladder wall thickening, or biliary dilatation. Stable mild ectasia of the common duct but normal tapering towards the pancreatic head. Pancreas: Unremarkable. No pancreatic ductal dilatation or surrounding inflammatory changes. Spleen: Normal in size without focal abnormality. Adrenals/Urinary Tract: Adrenal glands are unremarkable. Kidneys are normal, without focal lesion, or hydronephrosis. Bladder is unremarkable. Punctate nonobstructing right-sided renal stone. Stomach/Bowel: Stomach is underdistended with some luminal fluid. Small bowel  is nondilated. Large bowel has a normal course and caliber with scattered colonic stool. Normal appendix. There is however some subtle wall thickening along the right side of the colon near the hepatic flexure with stranding, best seen on coronal images such as image 56 of series 8. Please correlate for a subtle colitis. Vascular/Lymphatic: No significant vascular findings are present. No enlarged abdominal or pelvic lymph nodes. Reproductive: Multilobular uterus consistent with multiple fibroids. There is also a cystic structure in the left adnexa measuring 2.6 cm  which is likely ovarian. By CT this appears relatively simple. No follow-up imaging recommended. Note: This recommendation does not apply to premenarchal patients and to those with increased risk (genetic, family history, elevated tumor markers or other high-risk factors) of ovarian cancer. Reference: JACR 2020 Feb; 17(2):248-254 Other: Trace free fluid in the pelvis. No free air. Epigastric midline anterior abdominal wall hernia with rectus muscle diastasis and involvement of the transverse colon. This a wide mouth hernia. This also is not seen on the prior exam. Musculoskeletal: Slight degenerative changes along the spine. IMPRESSION: Mild wall thickening and stranding along the hepatic flexure of the colon. Please correlate for a subtle colitis. No obstruction, free air.  Normal appendix. New epigastric midline anterior abdominal wall hernia involving a portion of the transverse colon and mesenteric fat. No obstruction. This a wide mouth hernia. Uterine fibroids. Punctate nonobstructing right-sided renal stone. Electronically Signed   By: Karen Kays M.D.   On: 11/27/2022 10:06    Review of Systems  HENT:  Negative for ear discharge, ear pain, hearing loss and tinnitus.   Eyes:  Negative for photophobia and pain.  Respiratory:  Negative for cough and shortness of breath.   Cardiovascular:  Negative for chest pain.  Gastrointestinal:  Positive for abdominal distention, abdominal pain and constipation. Negative for nausea and vomiting.  Genitourinary:  Negative for dysuria, flank pain, frequency and urgency.  Musculoskeletal:  Negative for back pain, myalgias and neck pain.  Neurological:  Negative for dizziness and headaches.  Hematological:  Does not bruise/bleed easily.  Psychiatric/Behavioral:  The patient is not nervous/anxious.    Blood pressure (!) 176/106, pulse 99, temperature 98.7 F (37.1 C), temperature source Oral, resp. rate 18, height 5\' 4"  (1.626 m), weight 72.6 kg, last menstrual  period 11/19/2022, SpO2 98 %. Physical Exam Constitutional:  WDWN in NAD, conversant, no obvious deformities; lying in bed comfortably Eyes:  Pupils equal, round; sclera anicteric; moist conjunctiva; no lid lag HENT:  Oral mucosa moist; good dentition  Neck:  No masses palpated, trachea midline; no thyromegaly Lungs:  CTA bilaterally; normal respiratory effort CV:  Regular rate and rhythm; no murmurs; extremities well-perfused with no edema Abd:  +bowel sounds, soft, tender along entire right side of abdomen up to the costal margin; no guarding or peritonitis; Healed upper midline incision with palpable hernia - reducible. Musc:  Unable to assess gait; no apparent clubbing or cyanosis in extremities Lymphatic:  No palpable cervical or axillary lymphadenopathy Skin:  Warm, dry; no sign of jaundice Psychiatric - alert and oriented x 4; calm mood and affect  Assessment/Plan: Right-sided colitis Midline ventral incisional hernia after recent GYN procedure  No indications of acute appendicitis or any indications for emergent surgery Continue clear liquids until symptoms improve Continue abx regimen Follow WBC    Wilmon Arms Tanasia Budzinski 11/28/2022, 2:38 PM

## 2022-11-29 DIAGNOSIS — K529 Noninfective gastroenteritis and colitis, unspecified: Secondary | ICD-10-CM | POA: Diagnosis not present

## 2022-11-29 LAB — CBC WITH DIFFERENTIAL/PLATELET
Abs Immature Granulocytes: 0.09 10*3/uL — ABNORMAL HIGH (ref 0.00–0.07)
Basophils Absolute: 0.1 10*3/uL (ref 0.0–0.1)
Basophils Relative: 0 %
Eosinophils Absolute: 0.2 10*3/uL (ref 0.0–0.5)
Eosinophils Relative: 1 %
HCT: 28.6 % — ABNORMAL LOW (ref 36.0–46.0)
Hemoglobin: 8.2 g/dL — ABNORMAL LOW (ref 12.0–15.0)
Immature Granulocytes: 1 %
Lymphocytes Relative: 14 %
Lymphs Abs: 2.6 10*3/uL (ref 0.7–4.0)
MCH: 22 pg — ABNORMAL LOW (ref 26.0–34.0)
MCHC: 28.7 g/dL — ABNORMAL LOW (ref 30.0–36.0)
MCV: 76.7 fL — ABNORMAL LOW (ref 80.0–100.0)
Monocytes Absolute: 1 10*3/uL (ref 0.1–1.0)
Monocytes Relative: 6 %
Neutro Abs: 14.4 10*3/uL — ABNORMAL HIGH (ref 1.7–7.7)
Neutrophils Relative %: 78 %
Platelets: 510 10*3/uL — ABNORMAL HIGH (ref 150–400)
RBC: 3.73 MIL/uL — ABNORMAL LOW (ref 3.87–5.11)
RDW: 15.5 % (ref 11.5–15.5)
WBC: 18.3 10*3/uL — ABNORMAL HIGH (ref 4.0–10.5)
nRBC: 0 % (ref 0.0–0.2)

## 2022-11-29 LAB — COMPREHENSIVE METABOLIC PANEL
ALT: 10 U/L (ref 0–44)
AST: 10 U/L — ABNORMAL LOW (ref 15–41)
Albumin: 2.9 g/dL — ABNORMAL LOW (ref 3.5–5.0)
Alkaline Phosphatase: 47 U/L (ref 38–126)
Anion gap: 7 (ref 5–15)
BUN: 7 mg/dL (ref 6–20)
CO2: 19 mmol/L — ABNORMAL LOW (ref 22–32)
Calcium: 8.2 mg/dL — ABNORMAL LOW (ref 8.9–10.3)
Chloride: 112 mmol/L — ABNORMAL HIGH (ref 98–111)
Creatinine, Ser: 0.65 mg/dL (ref 0.44–1.00)
GFR, Estimated: >60 mL/min (ref >60)
Glucose, Bld: 110 mg/dL — ABNORMAL HIGH (ref 70–99)
Potassium: 4.1 mmol/L (ref 3.5–5.1)
Sodium: 138 mmol/L (ref 135–145)
Total Bilirubin: 0.3 mg/dL (ref 0.3–1.2)
Total Protein: 5.3 g/dL — ABNORMAL LOW (ref 6.5–8.1)

## 2022-11-29 LAB — CULTURE, BLOOD (ROUTINE X 2): Special Requests: ADEQUATE

## 2022-11-29 LAB — URINE CULTURE

## 2022-11-29 LAB — MAGNESIUM: Magnesium: 1.8 mg/dL (ref 1.7–2.4)

## 2022-11-29 MED ORDER — HYDRALAZINE HCL 25 MG PO TABS: 25.0000 mg | ORAL_TABLET | Freq: Four times a day (QID) | ORAL | Status: DC | PRN

## 2022-11-29 MED ORDER — ACETAMINOPHEN 325 MG PO TABS
650.0000 mg | ORAL_TABLET | Freq: Four times a day (QID) | ORAL | Status: DC | PRN
  Administered 2022-11-29: 650 mg via ORAL
  Filled 2022-11-29 (×2): qty 2

## 2022-11-29 MED ORDER — AMLODIPINE BESYLATE 5 MG PO TABS
5.0000 mg | ORAL_TABLET | Freq: Every day | ORAL | Status: DC
  Administered 2022-11-29: 5 mg via ORAL
  Filled 2022-11-29: qty 1

## 2022-11-29 NOTE — Progress Notes (Signed)
PROGRESS NOTE    Cheryl Delacruz  WUJ:[id number] DOB: [birth year] DOA: 11/27/2022 PCP: Verlon Au, MD   Brief Narrative:  42 y/o F with h/o asthma presented with worsening abdominal pain with nausea.  On presentation, she had leukocytosis and hypokalemia. CT abdomen and pelvis showed mild wall thickening and stranding along the hepatic flexure of the colon along with widemouth abdominal wall hernia.  She was started on IV fluids, broad-spectrum antibiotics and IV analgesics as needed.  General surgery was also subsequently normally consulted.  Assessment & Plan:   Severe abdominal pain Possible acute colitis Leukocytosis -Currently on Rocephin and Flagyl.  Still has significant leukocytosis but slightly improving WBCs to 18.3 today.  Cultures negative so far.  Stool for C. difficile is negative.  No significant diarrhea since admission. -Still significantly tender in the right lower quadrant.  General surgery following.  Currently on clear liquid diet.   -Continue IV fluids.  Continue as needed analgesics and antiemetics. -Continue IV Protonix.  Hyponatremia -Improved  Hypokalemia -Improve  Hypertension -No documented history of hypertension and patient does not take antihypertensives at home.  Blood pressure extremely elevated.  Will start oral antihypertensives.  Use IV antihypertensives as needed.  Microcytic anemia Iron deficiency anemia -Iron of 17 and iron saturation of 3.  Received 1 dose of IV iron on 11/28/2022.  Will need supplemental iron on discharge. -Hemoglobin on the lower side but stable.  No signs of bleeding  Thrombocytosis -Possibly reactive.  Monitor intermittently  Hypoglycemia -Continue IV fluids with D5 NS.  Acute metabolic acidosis -Mild.  Monitor intermittently.  Continue IV fluids.  Chronic intrinsic asthma -Currently stable.  Continue as needed albuterol as needed.  On room air.   DVT prophylaxis: Lovenox Code Status: Full Family  Communication: Husband at bedside Disposition Plan: Status is:  inpatient because: Of severity of illness.  Need for IV fluids and antibiotics.  Consultants: general surgery  Procedures: None  Antimicrobials: Rocephin and Zithromax from 11/27/2022 onwards   Subjective: Patient seen and examined at bedside.  Still complains of significant lower quadrant abdominal pain intermittently requiring IV fentanyl.  No fever or vomiting reported.  Tolerating clear liquid diet. Objective: Vitals:   11/28/22 2129 11/28/22 2240 11/28/22 2345 11/29/22 0540  BP: (!) 189/107 (!) 167/110 (!) 150/95 (!) 147/108  Pulse: 93 (!) 104 95 99  Resp:   18 18  Temp:   98.2 F (36.8 C) 98 F (36.7 C)  TempSrc:   Oral Oral  SpO2:   97% 98%  Weight:      Height:        Intake/Output Summary (Last 24 hours) at 11/29/2022 0729 Last data filed at 11/29/2022 0538 Gross per 24 hour  Intake 2370.9 ml  Output --  Net 2370.9 ml    Filed Weights   11/27/22 0813  Weight: 72.6 kg    Examination:  General: On room air.  No distress ENT/neck: No thyromegaly.  JVD is not elevated  respiratory: Decreased breath sounds at bases bilaterally with some crackles; no wheezing  CVS: S1-S2 heard, mild intermittent tachycardia present Abdominal: Soft, tender in the right lower quadrant, slightly distended; no organomegaly,  bowel sounds are heard Extremities: Trace lower extremity edema; no cyanosis  CNS: Awake and alert.  No focal neurologic deficit.  Moves extremities Lymph: No obvious lymphadenopathy Skin: No obvious ecchymosis/lesions  psych: Affect, judgment and mood are normal  musculoskeletal: No obvious joint swelling/deformity     Data Reviewed: I have personally reviewed  following labs and imaging studies  CBC: Recent Labs  Lab 11/27/22 0837 11/27/22 1037 11/27/22 1541 11/28/22 0533 11/29/22 0604  WBC 20.7*  --  21.7* 20.2* 18.3*  NEUTROABS 16.2*  --  18.3*  --  14.4*  HGB 8.6* 10.2* 8.3* 7.8*  8.2*  HCT 27.7* 30.0* 28.4* 26.0* 28.6*  MCV 71.2*  --  74.7* 73.7* 76.7*  PLT 528*  --  496* 489* 510*    Basic Metabolic Panel: Recent Labs  Lab 11/27/22 0837 11/27/22 1037 11/27/22 1541 11/28/22 0533 11/29/22 0604  NA 135 137 135 133* 138  K 3.0* 2.9* 3.5 3.1* 4.1  CL 103 103 106 104 112*  CO2 18*  --  18* 18* 19*  GLUCOSE 101* 81 72 61* 110*  BUN 9 6 7 6 7   CREATININE 0.74 0.80 0.58 0.70 0.65  CALCIUM 9.0  --  7.9* 8.0* 8.2*  MG  --   --   --   --  1.8    GFR: Estimated Creatinine Clearance: 91.3 mL/min (by C-G formula based on SCr of 0.65 mg/dL). Liver Function Tests: Recent Labs  Lab 11/27/22 0837 11/28/22 0533 11/29/22 0604  AST 14* 12* 10*  ALT 14 11 10   ALKPHOS 58 59 47  BILITOT 0.6 0.5 0.3  PROT 6.7 5.9* 5.3*  ALBUMIN 3.9 3.2* 2.9*    Recent Labs  Lab 11/27/22 0837  LIPASE 28    No results for input(s): "AMMONIA" in the last 168 hours. Coagulation Profile: No results for input(s): "INR", "PROTIME" in the last 168 hours. Cardiac Enzymes: No results for input(s): "CKTOTAL", "CKMB", "CKMBINDEX", "TROPONINI" in the last 168 hours. BNP (last 3 results) No results for input(s): "PROBNP" in the last 8760 hours. HbA1C: No results for input(s): "HGBA1C" in the last 72 hours. CBG: No results for input(s): "GLUCAP" in the last 168 hours. Lipid Profile: No results for input(s): "CHOL", "HDL", "LDLCALC", "TRIG", "CHOLHDL", "LDLDIRECT" in the last 72 hours. Thyroid Function Tests: Recent Labs    11/27/22 1623  TSH 1.126    Anemia Panel: Recent Labs    11/27/22 1623  VITAMINB12 348  FOLATE 11.2  FERRITIN 7*  TIBC 573*  IRON 17*  RETICCTPCT 1.7    Sepsis Labs: Recent Labs  Lab 11/27/22 1341  PROCALCITON <0.10  LATICACIDVEN 1.1     Recent Results (from the past 240 hour(s))  Urine Culture (for pregnant, neutropenic or urologic patients or patients with an indwelling urinary catheter)     Status: Abnormal (Preliminary result)    Collection Time: 11/27/22 12:06 PM   Specimen: Urine, Clean Catch  Result Value Ref Range Status   Specimen Description   Final    URINE, CLEAN CATCH Performed at [facility], [address] [place], [place] [address]    Special Requests   Final    NONE Performed at [facility], [address], [place], [place] [address]    Culture 20,000 COLONIES/mL ESCHERICHIA COLI (A)  Final   Report Status PENDING  Incomplete  C Difficile Quick Screen w PCR reflex     Status: None   Collection Time: 11/27/22  1:33 PM   Specimen: STOOL  Result Value Ref Range Status   C Diff antigen NEGATIVE NEGATIVE Final   C Diff toxin NEGATIVE NEGATIVE Final   C Diff interpretation No C. difficile detected.  Final    Comment: Performed at [facility], [address] [place], [place] [address]  Culture, blood (Routine  X 2) w Reflex to ID Panel     Status: None (Preliminary result)   Collection Time: 11/27/22  1:41 PM   Specimen: BLOOD  Result Value Ref Range Status   Specimen Description   Final    BLOOD RIGHT ANTECUBITAL Performed at [facility], [address] [place], [place] [address]    Special Requests   Final    BOTTLES DRAWN AEROBIC AND ANAEROBIC Blood Culture adequate volume Performed at [facility], [address] [place], [place] [address]    Culture   Final    NO GROWTH < 24 HOURS Performed at [facility], [address] [place], [place] [address]    Report Status PENDING  Incomplete  Culture, blood (Routine X 2) w Reflex to ID Panel     Status: None (Preliminary result)   Collection Time: 11/27/22  1:58 PM   Specimen: BLOOD  Result Value Ref Range Status   Specimen Description   Final    BLOOD LEFT ANTECUBITAL Performed at [facility], [address], [place], [place] [address]    Special Requests   Final    BOTTLES DRAWN AEROBIC AND ANAEROBIC Blood  Culture adequate volume Performed at [facility], [address], [place], [place] [address]    Culture   Final    NO GROWTH < 24 HOURS Performed at [facility], [address] [place], [place] [address]    Report Status PENDING  Incomplete         Radiology Studies: CT ABDOMEN PELVIS W CONTRAST  Result Date: 11/27/2022 CLINICAL DATA:  Right lower quadrant abdominal pain with nausea. EXAM: CT ABDOMEN AND PELVIS WITH CONTRAST TECHNIQUE: Multidetector CT imaging of the abdomen and pelvis was performed using the standard protocol following bolus administration of intravenous contrast. RADIATION DOSE REDUCTION: This exam was performed according to the departmental dose-optimization program which includes automated exposure control, adjustment of the mA and/or kV according to patient size and/or use of iterative reconstruction technique. CONTRAST:  OMNIPAQUE IOHEXOL 300 MG/ML  SOLN COMPARISON:  Ultrasound and CT 03/13/2022. Older CT examinations as well FINDINGS: Lower chest: There is some linear opacities along bases likely scar or atelectasis. No pleural effusion. Small hiatal hernia. Hepatobiliary: No focal liver abnormality is seen. No gallstones, gallbladder wall thickening, or biliary dilatation. Stable mild ectasia of the common duct but normal tapering towards the pancreatic head. Pancreas: Unremarkable. No pancreatic ductal dilatation or surrounding inflammatory changes. Spleen: Normal in size without focal abnormality. Adrenals/Urinary Tract: Adrenal glands are unremarkable. Kidneys are normal, without focal lesion, or hydronephrosis. Bladder is unremarkable. Punctate nonobstructing right-sided renal stone. Stomach/Bowel: Stomach is underdistended with some luminal fluid. Small bowel is nondilated. Large bowel has a normal course and caliber with scattered colonic stool. Normal appendix. There is however some subtle wall thickening along the right side of the  colon near the hepatic flexure with stranding, best seen on coronal images such as image 56 of series 8. Please correlate for a subtle colitis. Vascular/Lymphatic: No significant vascular findings are present. No enlarged abdominal or pelvic lymph nodes. Reproductive: Multilobular uterus consistent with multiple fibroids. There is also a cystic structure in the left adnexa measuring 2.6 cm which is likely ovarian. By CT this appears relatively simple. No follow-up imaging recommended. Note: This recommendation does not apply to premenarchal patients and to those with increased risk (genetic, family history, elevated tumor markers or other high-risk factors) of ovarian  cancer. Reference: JACR 2020 Feb; 17(2):248-254 Other: Trace free fluid in the pelvis. No free air. Epigastric midline anterior abdominal wall hernia with rectus muscle diastasis and involvement of the transverse colon. This a wide mouth hernia. This also is not seen on the prior exam. Musculoskeletal: Slight degenerative changes along the spine. IMPRESSION: Mild wall thickening and stranding along the hepatic flexure of the colon. Please correlate for a subtle colitis. No obstruction, free air.  Normal appendix. New epigastric midline anterior abdominal wall hernia involving a portion of the transverse colon and mesenteric fat. No obstruction. This a wide mouth hernia. Uterine fibroids. Punctate nonobstructing right-sided renal stone. Electronically Signed   By: Karen Kays M.D.   On: 11/27/2022 10:06        Scheduled Meds:  enoxaparin (LOVENOX) injection  40 mg Subcutaneous QHS   pantoprazole (PROTONIX) IV  40 mg Intravenous Q12H   sodium chloride flush  3 mL Intravenous Q12H   Continuous Infusions:  cefTRIAXone (ROCEPHIN)  IV 2 g (11/28/22 1134)   dextrose 5 % and 0.9 % NaCl with KCl 40 mEq/L 100 mL/hr at 11/29/22 0107   metronidazole 500 mg (11/28/22 2149)   promethazine (PHENERGAN) injection (IM or IVPB)            Glade Lloyd, MD [facility] 11/29/2022, 7:29 AM

## 2022-11-29 NOTE — Progress Notes (Signed)
Subjective/Chief Complaint: Right-sided abdominal tenderness slightly improved Had a bowel movement yesterday that relieved some of the pressure Tolerating clear liquids WBC trending down   Objective: Vital signs in last 24 hours: Temp:  [98 F (36.7 C)-98.7 F (37.1 C)] 98 F (36.7 C) (06/30 0540) Pulse Rate:  [93-104] 99 (06/30 0540) Resp:  [18-20] 18 (06/30 0540) BP: (147-189)/(95-121) 147/108 (06/30 0540) SpO2:  [95 %-99 %] 98 % (06/30 0540) Last BM Date : 11/26/22  Intake/Output from previous day: 06/29 0701 - 06/30 0700 In: 2370.9 [P.O.:360; I.V.:1440.9; IV Piggyback:570] Out: -  Intake/Output this shift: No intake/output data recorded.  Abd - soft, some right-sided abdominal tenderness; no generalized peritonitis  Lab Results:  Recent Labs    11/28/22 0533 11/29/22 0604  WBC 20.2* 18.3*  HGB 7.8* 8.2*  HCT 26.0* 28.6*  PLT 489* 510*   BMET Recent Labs    11/28/22 0533 11/29/22 0604  NA 133* 138  K 3.1* 4.1  CL 104 112*  CO2 18* 19*  GLUCOSE 61* 110*  BUN 6 7  CREATININE 0.70 0.65  CALCIUM 8.0* 8.2*   PT/INR No results for input(s): "LABPROT", "INR" in the last 72 hours. ABG No results for input(s): "PHART", "HCO3" in the last 72 hours.  Invalid input(s): "PCO2", "PO2"  Studies/Results: CT ABDOMEN PELVIS W CONTRAST  Result Date: 11/27/2022 CLINICAL DATA:  Right lower quadrant abdominal pain with nausea. EXAM: CT ABDOMEN AND PELVIS WITH CONTRAST TECHNIQUE: Multidetector CT imaging of the abdomen and pelvis was performed using the standard protocol following bolus administration of intravenous contrast. RADIATION DOSE REDUCTION: This exam was performed according to the departmental dose-optimization program which includes automated exposure control, adjustment of the mA and/or kV according to patient size and/or use of iterative reconstruction technique. CONTRAST:  OMNIPAQUE IOHEXOL 300 MG/ML  SOLN COMPARISON:  Ultrasound and CT  03/13/2022. Older CT examinations as well FINDINGS: Lower chest: There is some linear opacities along bases likely scar or atelectasis. No pleural effusion. Small hiatal hernia. Hepatobiliary: No focal liver abnormality is seen. No gallstones, gallbladder wall thickening, or biliary dilatation. Stable mild ectasia of the common duct but normal tapering towards the pancreatic head. Pancreas: Unremarkable. No pancreatic ductal dilatation or surrounding inflammatory changes. Spleen: Normal in size without focal abnormality. Adrenals/Urinary Tract: Adrenal glands are unremarkable. Kidneys are normal, without focal lesion, or hydronephrosis. Bladder is unremarkable. Punctate nonobstructing right-sided renal stone. Stomach/Bowel: Stomach is underdistended with some luminal fluid. Small bowel is nondilated. Large bowel has a normal course and caliber with scattered colonic stool. Normal appendix. There is however some subtle wall thickening along the right side of the colon near the hepatic flexure with stranding, best seen on coronal images such as image 56 of series 8. Please correlate for a subtle colitis. Vascular/Lymphatic: No significant vascular findings are present. No enlarged abdominal or pelvic lymph nodes. Reproductive: Multilobular uterus consistent with multiple fibroids. There is also a cystic structure in the left adnexa measuring 2.6 cm which is likely ovarian. By CT this appears relatively simple. No follow-up imaging recommended. Note: This recommendation does not apply to premenarchal patients and to those with increased risk (genetic, family history, elevated tumor markers or other high-risk factors) of ovarian cancer. Reference: JACR 2020 Feb; 17(2):248-254 Other: Trace free fluid in the pelvis. No free air. Epigastric midline anterior abdominal wall hernia with rectus muscle diastasis and involvement of the transverse colon. This a wide mouth hernia. This also is not seen on the prior exam.  Musculoskeletal: Slight degenerative changes along the spine. IMPRESSION: Mild wall thickening and stranding along the hepatic flexure of the colon. Please correlate for a subtle colitis. No obstruction, free air.  Normal appendix. New epigastric midline anterior abdominal wall hernia involving a portion of the transverse colon and mesenteric fat. No obstruction. This a wide mouth hernia. Uterine fibroids. Punctate nonobstructing right-sided renal stone. Electronically Signed   By: Karen Kays M.D.   On: 11/27/2022 10:06    Anti-infectives: Anti-infectives (From admission, onward)    Start     Dose/Rate Route Frequency Ordered Stop   11/28/22 1000  cefTRIAXone (ROCEPHIN) 2 g in sodium chloride 0.9 % 100 mL IVPB        2 g 200 mL/hr over 30 Minutes Intravenous Every 24 hours 11/27/22 1550     11/27/22 2200  metroNIDAZOLE (FLAGYL) IVPB 500 mg        500 mg 100 mL/hr over 60 Minutes Intravenous Every 12 hours 11/27/22 1550     11/27/22 1230  metroNIDAZOLE (FLAGYL) IVPB 500 mg        500 mg 100 mL/hr over 60 Minutes Intravenous  Once 11/27/22 1220 11/27/22 1500   11/27/22 1230  cefTRIAXone (ROCEPHIN) 2 g in sodium chloride 0.9 % 100 mL IVPB        2 g 200 mL/hr over 30 Minutes Intravenous  Once 11/27/22 1220 11/27/22 1354       Assessment/Plan: Right-sided colitis  Slight improvement - decreasing WBC, having BM. Continue current treatment Advance to full liquids - do not advance past this point Encourage ambulation  LOS: 1 day    Cheryl Delacruz 11/29/2022

## 2022-11-29 NOTE — Progress Notes (Signed)
   11/29/22 0933  TOC Brief Assessment  Insurance and Status Reviewed  Patient has primary care physician Yes  Home environment has been reviewed home with spouse  Prior level of function: independent  Prior/Current Home Services No current home services  Social Determinants of Health Reivew SDOH reviewed no interventions necessary  Readmission risk has been reviewed Yes  Transition of care needs no transition of care needs at this time

## 2022-11-29 NOTE — Progress Notes (Signed)
Notified Anthoney Harada, NP of pt's current BP reading of 147/110. No orders received, at present time.

## 2022-11-30 DIAGNOSIS — K529 Noninfective gastroenteritis and colitis, unspecified: Secondary | ICD-10-CM | POA: Diagnosis not present

## 2022-11-30 LAB — CBC WITH DIFFERENTIAL/PLATELET
Abs Immature Granulocytes: 0.04 10*3/uL (ref 0.00–0.07)
Basophils Absolute: 0.1 10*3/uL (ref 0.0–0.1)
Basophils Relative: 1 %
Eosinophils Absolute: 0.9 10*3/uL — ABNORMAL HIGH (ref 0.0–0.5)
Eosinophils Relative: 7 %
HCT: 25.6 % — ABNORMAL LOW (ref 36.0–46.0)
Hemoglobin: 7.3 g/dL — ABNORMAL LOW (ref 12.0–15.0)
Immature Granulocytes: 0 %
Lymphocytes Relative: 30 %
Lymphs Abs: 3.5 10*3/uL (ref 0.7–4.0)
MCH: 21.9 pg — ABNORMAL LOW (ref 26.0–34.0)
MCHC: 28.5 g/dL — ABNORMAL LOW (ref 30.0–36.0)
MCV: 76.9 fL — ABNORMAL LOW (ref 80.0–100.0)
Monocytes Absolute: 0.8 10*3/uL (ref 0.1–1.0)
Monocytes Relative: 7 %
Neutro Abs: 6.5 10*3/uL (ref 1.7–7.7)
Neutrophils Relative %: 55 %
Platelets: 480 10*3/uL — ABNORMAL HIGH (ref 150–400)
RBC: 3.33 MIL/uL — ABNORMAL LOW (ref 3.87–5.11)
RDW: 15.6 % — ABNORMAL HIGH (ref 11.5–15.5)
WBC: 11.9 10*3/uL — ABNORMAL HIGH (ref 4.0–10.5)
nRBC: 0 % (ref 0.0–0.2)

## 2022-11-30 LAB — BASIC METABOLIC PANEL
Anion gap: 4 — ABNORMAL LOW (ref 5–15)
BUN: <5 mg/dL — ABNORMAL LOW (ref 6–20)
CO2: 22 mmol/L (ref 22–32)
Calcium: 8.3 mg/dL — ABNORMAL LOW (ref 8.9–10.3)
Chloride: 112 mmol/L — ABNORMAL HIGH (ref 98–111)
Creatinine, Ser: 0.55 mg/dL (ref 0.44–1.00)
GFR, Estimated: >60 mL/min (ref >60)
Glucose, Bld: 91 mg/dL (ref 70–99)
Potassium: 3.8 mmol/L (ref 3.5–5.1)
Sodium: 138 mmol/L (ref 135–145)

## 2022-11-30 LAB — MAGNESIUM: Magnesium: 1.7 mg/dL (ref 1.7–2.4)

## 2022-11-30 LAB — CULTURE, BLOOD (ROUTINE X 2): Culture: NO GROWTH

## 2022-11-30 MED ORDER — OXYCODONE HCL 5 MG PO TABS
5.0000 mg | ORAL_TABLET | ORAL | Status: DC | PRN
  Administered 2022-11-30 – 2022-12-01 (×3): 10 mg via ORAL
  Filled 2022-11-30 (×3): qty 2

## 2022-11-30 MED ORDER — AMLODIPINE BESYLATE 10 MG PO TABS
10.0000 mg | ORAL_TABLET | Freq: Every day | ORAL | Status: DC
  Administered 2022-11-30 – 2022-12-01 (×2): 10 mg via ORAL
  Filled 2022-11-30 (×2): qty 1

## 2022-11-30 MED ORDER — METHOCARBAMOL 500 MG PO TABS
500.0000 mg | ORAL_TABLET | Freq: Four times a day (QID) | ORAL | Status: DC | PRN
  Administered 2022-11-30: 500 mg via ORAL
  Filled 2022-11-30: qty 1

## 2022-11-30 NOTE — Progress Notes (Signed)
   Subjective/Chief Complaint: Less tender on right side WBC improving Tolerating full liquids Afebrile   Objective: Vital signs in last 24 hours: Temp:  [98.1 F (36.7 C)-98.4 F (36.9 C)] 98.1 F (36.7 C) (07/01 0614) Pulse Rate:  [91-105] 91 (07/01 0614) Resp:  [16-18] 16 (07/01 0614) BP: (142-161)/(91-100) 161/100 (07/01 0614) SpO2:  [96 %-100 %] 100 % (07/01 0614) Last BM Date : 11/28/22  Intake/Output from previous day: 06/30 0701 - 07/01 0700 In: 2212 [P.O.:240; I.V.:1690.4; IV Piggyback:281.6] Out: 0  Intake/Output this shift: No intake/output data recorded.  General appearance: alert, cooperative, and no distress GI: soft, non-distended; mild tenderness right side  Lab Results:  Recent Labs    11/29/22 0604 11/30/22 0411  WBC 18.3* 11.9*  HGB 8.2* 7.3*  HCT 28.6* 25.6*  PLT 510* 480*   BMET Recent Labs    11/29/22 0604 11/30/22 0411  NA 138 138  K 4.1 3.8  CL 112* 112*  CO2 19* 22  GLUCOSE 110* 91  BUN 7 <5*  CREATININE 0.65 0.55  CALCIUM 8.2* 8.3*   PT/INR No results for input(s): "LABPROT", "INR" in the last 72 hours. ABG No results for input(s): "PHART", "HCO3" in the last 72 hours.  Invalid input(s): "PCO2", "PO2"  Studies/Results: No results found.  Anti-infectives: Anti-infectives (From admission, onward)    Start     Dose/Rate Route Frequency Ordered Stop   11/28/22 1000  cefTRIAXone (ROCEPHIN) 2 g in sodium chloride 0.9 % 100 mL IVPB        2 g 200 mL/hr over 30 Minutes Intravenous Every 24 hours 11/27/22 1550     11/27/22 2200  metroNIDAZOLE (FLAGYL) IVPB 500 mg        500 mg 100 mL/hr over 60 Minutes Intravenous Every 12 hours 11/27/22 1550     11/27/22 1230  metroNIDAZOLE (FLAGYL) IVPB 500 mg        500 mg 100 mL/hr over 60 Minutes Intravenous  Once 11/27/22 1220 11/27/22 1500   11/27/22 1230  cefTRIAXone (ROCEPHIN) 2 g in sodium chloride 0.9 % 100 mL IVPB        2 g 200 mL/hr over 30 Minutes Intravenous  Once  11/27/22 1220 11/27/22 1354       Assessment/Plan: Right-sided colitis - uncertain etiology  Consider transition to PO abx  Advance to soft diet Discharge per primary team Recommend GI follow-up as outpatient for colonoscopy in a few months  Will sign off for now.  Please call us back if needed.  LOS: 2 days    Cheryl Delacruz 11/30/2022

## 2022-11-30 NOTE — Progress Notes (Signed)
PROGRESS NOTE    Cheryl Delacruz  WUJ:[id number] DOB: [birth year] DOA: 11/27/2022 PCP: Verlon Au, MD   Brief Narrative:  42 y/o F with h/o asthma presented with worsening abdominal pain with nausea.  On presentation, she had leukocytosis and hypokalemia. CT abdomen and pelvis showed mild wall thickening and stranding along the hepatic flexure of the colon along with widemouth abdominal wall hernia.  She was started on IV fluids, broad-spectrum antibiotics and IV analgesics as needed.  General surgery was also subsequently normally consulted.  Assessment & Plan:   Severe abdominal pain Possible acute colitis Leukocytosis -Currently on Rocephin and Flagyl.  Improving WBCs to 11.9 today.  Cultures negative so far.  Stool for C. difficile is negative.  No significant diarrhea since admission. DC isolation -Still having some intermittent abdominal pain.  General surgery following.  Currently on full liquid diet. -DC IV fluids.  Continue as needed analgesics and antiemetics. -Continue IV Protonix.  Hyponatremia -Improved  Hypokalemia -Improved  Hypertension -No documented history of hypertension and patient does not take antihypertensives at home.  Blood pressure extremely elevated.  Increase amlodipine to 1 mg daily.  Use IV antihypertensives as needed.  Microcytic anemia Iron deficiency anemia -Iron of 17 and iron saturation of 3.  Received 1 dose of IV iron on 11/28/2022.  Will need supplemental iron on discharge. -Hemoglobin on the lower side but stable.  No signs of bleeding  Thrombocytosis -Possibly reactive.  Monitor intermittently  Hypoglycemia -On IV fluids.  DC today and monitor.  Acute metabolic acidosis -Mild.  Monitor intermittently.  Improved..  Chronic intrinsic asthma -Currently stable.  Continue as needed albuterol as needed.  On room air.   DVT prophylaxis: Lovenox Code Status: Full Family Communication: Husband at bedside Disposition  Plan: Status is:  inpatient because: Of severity of illness.  Need for IV fluids and antibiotics.  Consultants: general surgery  Procedures: None  Antimicrobials: Rocephin and Zithromax from 11/27/2022 onwards   Subjective: Patient seen and examined at bedside.  Tolerating full liquid diet.  Still having intermittent lower abdominal pain.  No fever or shortness of breath reported.  Objective: Vitals:   11/29/22 0540 11/29/22 1436 11/29/22 1940 11/30/22 0614  BP: (!) 147/108 (!) 142/91 (!) 144/99 (!) 161/100  Pulse: 99 (!) 105 98 91  Resp: 18 18 18 16   Temp: 98 F (36.7 C) 98.2 F (36.8 C) 98.4 F (36.9 C) 98.1 F (36.7 C)  TempSrc: Oral Oral Oral Oral  SpO2: 98% 96% 100% 100%  Weight:      Height:        Intake/Output Summary (Last 24 hours) at 11/30/2022 1106 Last data filed at 11/30/2022 1000 Gross per 24 hour  Intake 2743.25 ml  Output 0 ml  Net 2743.25 ml    Filed Weights   11/27/22 0813  Weight: 72.6 kg    Examination:  General: No acute distress.  Still on room air.   ENT/neck: No obvious neck masses or JVD elevation noted  respiratory: Bilateral decreased breath sound at bases with scattered crackles  CVS: Currently rate controlled; S1-S2 heard  abdominal: Soft, still tender in the right lower quadrant; mildly distended; no organomegaly, normal bowel sounds heard  extremities: No clubbing; mild lower extremity edema present CNS: Alert and oriented.  No focal deficits noted  lymph: No palpable lymphadenopathy Skin: No obvious rashes/petechiae  psych: Judgment, affect and mood are normal  musculoskeletal: No obvious joint erythema/tenderness    Data Reviewed: I have personally reviewed following  labs and imaging studies  CBC: Recent Labs  Lab 11/27/22 0837 11/27/22 1037 11/27/22 1541 11/28/22 0533 11/29/22 0604 11/30/22 0411  WBC 20.7*  --  21.7* 20.2* 18.3* 11.9*  NEUTROABS 16.2*  --  18.3*  --  14.4* 6.5  HGB 8.6* 10.2* 8.3* 7.8* 8.2* 7.3*  HCT  27.7* 30.0* 28.4* 26.0* 28.6* 25.6*  MCV 71.2*  --  74.7* 73.7* 76.7* 76.9*  PLT 528*  --  496* 489* 510* 480*    Basic Metabolic Panel: Recent Labs  Lab 11/27/22 0837 11/27/22 1037 11/27/22 1541 11/28/22 0533 11/29/22 0604 11/30/22 0411  NA 135 137 135 133* 138 138  K 3.0* 2.9* 3.5 3.1* 4.1 3.8  CL 103 103 106 104 112* 112*  CO2 18*  --  18* 18* 19* 22  GLUCOSE 101* 81 72 61* 110* 91  BUN 9 6 7 6 7  <5*  CREATININE 0.74 0.80 0.58 0.70 0.65 0.55  CALCIUM 9.0  --  7.9* 8.0* 8.2* 8.3*  MG  --   --   --   --  1.8 1.7    GFR: Estimated Creatinine Clearance: 91.3 mL/min (by C-G formula based on SCr of 0.55 mg/dL). Liver Function Tests: Recent Labs  Lab 11/27/22 0837 11/28/22 0533 11/29/22 0604  AST 14* 12* 10*  ALT 14 11 10   ALKPHOS 58 59 47  BILITOT 0.6 0.5 0.3  PROT 6.7 5.9* 5.3*  ALBUMIN 3.9 3.2* 2.9*    Recent Labs  Lab 11/27/22 0837  LIPASE 28    No results for input(s): "AMMONIA" in the last 168 hours. Coagulation Profile: No results for input(s): "INR", "PROTIME" in the last 168 hours. Cardiac Enzymes: No results for input(s): "CKTOTAL", "CKMB", "CKMBINDEX", "TROPONINI" in the last 168 hours. BNP (last 3 results) No results for input(s): "PROBNP" in the last 8760 hours. HbA1C: No results for input(s): "HGBA1C" in the last 72 hours. CBG: No results for input(s): "GLUCAP" in the last 168 hours. Lipid Profile: No results for input(s): "CHOL", "HDL", "LDLCALC", "TRIG", "CHOLHDL", "LDLDIRECT" in the last 72 hours. Thyroid Function Tests: Recent Labs    11/27/22 1623  TSH 1.126    Anemia Panel: Recent Labs    11/27/22 1623  VITAMINB12 348  FOLATE 11.2  FERRITIN 7*  TIBC 573*  IRON 17*  RETICCTPCT 1.7    Sepsis Labs: Recent Labs  Lab 11/27/22 1341  PROCALCITON <0.10  LATICACIDVEN 1.1     Recent Results (from the past 240 hour(s))  Urine Culture (for pregnant, neutropenic or urologic patients or patients with an indwelling urinary  catheter)     Status: Abnormal   Collection Time: 11/27/22 12:06 PM   Specimen: Urine, Clean Catch  Result Value Ref Range Status   Specimen Description   Final    URINE, CLEAN CATCH Performed at [facility], [address] [place], [place] [address]    Special Requests   Final    NONE Performed at [facility], [address] [place], [place] [address]    Culture 20,000 COLONIES/mL ESCHERICHIA COLI (A)  Final   Report Status 11/29/2022 FINAL  Final   Organism ID, Bacteria ESCHERICHIA COLI (A)  Final      Susceptibility   Escherichia coli - MIC*    AMPICILLIN >=32 RESISTANT Resistant     CEFAZOLIN <=4 SENSITIVE Sensitive     CEFEPIME <=0.12 SENSITIVE Sensitive     CEFTRIAXONE <=0.25 SENSITIVE Sensitive     CIPROFLOXACIN <=0.25 SENSITIVE Sensitive  GENTAMICIN <=1 SENSITIVE Sensitive     IMIPENEM <=0.25 SENSITIVE Sensitive     NITROFURANTOIN <=16 SENSITIVE Sensitive     TRIMETH/SULFA >=320 RESISTANT Resistant     AMPICILLIN/SULBACTAM 16 INTERMEDIATE Intermediate     PIP/TAZO <=4 SENSITIVE Sensitive     * 20,000 COLONIES/mL ESCHERICHIA COLI  C Difficile Quick Screen w PCR reflex     Status: None   Collection Time: 11/27/22  1:33 PM   Specimen: STOOL  Result Value Ref Range Status   C Diff antigen NEGATIVE NEGATIVE Final   C Diff toxin NEGATIVE NEGATIVE Final   C Diff interpretation No C. difficile detected.  Final    Comment: Performed at [facility], [address] [place], [place] [address]  Culture, blood (Routine X 2) w Reflex to ID Panel     Status: None (Preliminary result)   Collection Time: 11/27/22  1:41 PM   Specimen: BLOOD  Result Value Ref Range Status   Specimen Description   Final    BLOOD RIGHT ANTECUBITAL Performed at [facility], [address] [place], [place] [address]    Special Requests   Final    BOTTLES DRAWN AEROBIC AND ANAEROBIC Blood Culture adequate  volume Performed at [facility], [address], [place], [place] [address]    Culture   Final    NO GROWTH 2 DAYS Performed at [facility], [address], [place], [place] [address]    Report Status PENDING  Incomplete  Culture, blood (Routine X 2) w Reflex to ID Panel     Status: None (Preliminary result)   Collection Time: 11/27/22  1:58 PM   Specimen: BLOOD  Result Value Ref Range Status   Specimen Description   Final    BLOOD LEFT ANTECUBITAL Performed at [facility], [address], [place], [place] [address]    Special Requests   Final    BOTTLES DRAWN AEROBIC AND ANAEROBIC Blood Culture adequate volume Performed at [facility], [address], [place], [place] [address]    Culture   Final    NO GROWTH 2 DAYS Performed at [facility], [address] [place], [place] [address]    Report Status PENDING  Incomplete         Radiology Studies: No results found.      Scheduled Meds:  amLODipine  10 mg Oral Daily   enoxaparin (LOVENOX) injection  40 mg Subcutaneous QHS   pantoprazole (PROTONIX) IV  40 mg Intravenous Q12H   sodium chloride flush  3 mL Intravenous Q12H   Continuous Infusions:  cefTRIAXone (ROCEPHIN)  IV 2 g (11/30/22 1019)   dextrose 5 % and 0.9 % NaCl with KCl 40 mEq/L 75 mL/hr at 11/30/22 0414   metronidazole 500 mg (11/30/22 1059)   promethazine (PHENERGAN) injection (IM or IVPB)            Glade Lloyd, MD [facility] 11/30/2022, 11:06 AM

## 2022-12-01 LAB — CBC WITH DIFFERENTIAL/PLATELET
Abs Immature Granulocytes: 0.05 10*3/uL (ref 0.00–0.07)
Basophils Absolute: 0.1 10*3/uL (ref 0.0–0.1)
Basophils Relative: 1 %
Eosinophils Absolute: 0.9 10*3/uL — ABNORMAL HIGH (ref 0.0–0.5)
Eosinophils Relative: 8 %
HCT: 27.5 % — ABNORMAL LOW (ref 36.0–46.0)
Hemoglobin: 8.2 g/dL — ABNORMAL LOW (ref 12.0–15.0)
Immature Granulocytes: 1 %
Lymphocytes Relative: 29 %
Lymphs Abs: 3.1 10*3/uL (ref 0.7–4.0)
MCH: 22.6 pg — ABNORMAL LOW (ref 26.0–34.0)
MCHC: 29.8 g/dL — ABNORMAL LOW (ref 30.0–36.0)
MCV: 75.8 fL — ABNORMAL LOW (ref 80.0–100.0)
Monocytes Absolute: 0.9 10*3/uL (ref 0.1–1.0)
Monocytes Relative: 8 %
Neutro Abs: 5.6 10*3/uL (ref 1.7–7.7)
Neutrophils Relative %: 53 %
Platelets: 523 10*3/uL — ABNORMAL HIGH (ref 150–400)
RBC: 3.63 MIL/uL — ABNORMAL LOW (ref 3.87–5.11)
RDW: 15.6 % — ABNORMAL HIGH (ref 11.5–15.5)
WBC: 10.6 10*3/uL — ABNORMAL HIGH (ref 4.0–10.5)
nRBC: 0 % (ref 0.0–0.2)

## 2022-12-01 LAB — BASIC METABOLIC PANEL
Anion gap: 6 (ref 5–15)
BUN: <5 mg/dL — ABNORMAL LOW (ref 6–20)
CO2: 24 mmol/L (ref 22–32)
Calcium: 8.4 mg/dL — ABNORMAL LOW (ref 8.9–10.3)
Chloride: 104 mmol/L (ref 98–111)
Creatinine, Ser: 0.65 mg/dL (ref 0.44–1.00)
GFR, Estimated: >60 mL/min (ref >60)
Glucose, Bld: 91 mg/dL (ref 70–99)
Potassium: 3.5 mmol/L (ref 3.5–5.1)
Sodium: 134 mmol/L — ABNORMAL LOW (ref 135–145)

## 2022-12-01 LAB — CULTURE, BLOOD (ROUTINE X 2): Special Requests: ADEQUATE

## 2022-12-01 LAB — MAGNESIUM: Magnesium: 1.8 mg/dL (ref 1.7–2.4)

## 2022-12-01 MED ORDER — PANTOPRAZOLE SODIUM 40 MG PO TBEC: 40.0000 mg | DELAYED_RELEASE_TABLET | Freq: Every day | ORAL | 0 refills | Status: AC

## 2022-12-01 MED ORDER — AMOXICILLIN-POT CLAVULANATE 875-125 MG PO TABS: 1.0000 | ORAL_TABLET | Freq: Two times a day (BID) | ORAL | 0 refills | Status: AC

## 2022-12-01 MED ORDER — OXYCODONE-ACETAMINOPHEN 5-325 MG PO TABS: 1.0000 | ORAL_TABLET | Freq: Four times a day (QID) | ORAL | 0 refills | Status: AC | PRN

## 2022-12-01 MED ORDER — FERROUS SULFATE 325 (65 FE) MG PO TBEC: 325.0000 mg | DELAYED_RELEASE_TABLET | Freq: Two times a day (BID) | ORAL | 0 refills | Status: AC

## 2022-12-01 MED ORDER — AMLODIPINE BESYLATE 10 MG PO TABS: 10.0000 mg | ORAL_TABLET | Freq: Every day | ORAL | 0 refills | Status: AC

## 2022-12-01 NOTE — Progress Notes (Signed)
   12/01/22 0500  Vitals  Temp 98.4 F (36.9 C)  Temp Source Oral  BP (!) 140/95  BP Location Left Arm  BP Method Automatic  Patient Position (if appropriate) Lying  Pulse Rate 89  Pulse Rate Source Monitor  Resp 17  MEWS COLOR  MEWS Score Color Green  MEWS Score  MEWS Temp 0  MEWS Systolic 0  MEWS Pulse 0  MEWS RR 0  MEWS LOC 0  MEWS Score 0   Pt has a B/P that has been routinely high. Last Systolic B/P taken was 170. Patient had previously been up and moving. Current B/P is down to 140 after re-check. B/P is currently 140/95

## 2022-12-01 NOTE — Discharge Summary (Signed)
Physician Discharge Summary  Cheryl Delacruz ZOX:[id number] DOB: [birth year] DOA: 11/27/2022  PCP: Verlon Au, MD  Admit date: 11/27/2022 Discharge date: 12/01/2022  Admitted From: Home Disposition: Home  Recommendations for Outpatient Follow-up:  Follow up with PCP in 1 week with repeat CBC/BMP Outpatient follow-up with GI Outpatient follow-up with general surgery if needed Follow up in ED if symptoms worsen or new appear   Home Health: No Equipment/Devices: None  Discharge Condition: Stable CODE STATUS: Full Diet recommendation: Heart healthy/soft diet  Brief/Interim Summary: 42 y/o F with h/o asthma presented with worsening abdominal pain with nausea. On presentation, she had leukocytosis and hypokalemia. CT abdomen and pelvis showed mild wall thickening and stranding along the hepatic flexure of the colon along with widemouth abdominal wall hernia. She was started on IV fluids, broad-spectrum antibiotics and IV analgesics as needed. General surgery was also subsequently consulted.  Patient was managed conservatively.  During the hospitalization, her condition has gradually improved.  Her diet has been advanced and she is currently tolerating soft diet.  General surgery has signed off.  She will be discharged home today on oral antibiotics.  Outpatient follow-up with PCP.  Discharge Diagnoses:   Severe abdominal pain Possible acute colitis Leukocytosis -Currently on Rocephin and Flagyl.  WBCs have normalized to 10.6.Marland Kitchen  Cultures negative so far.  Stool for C. difficile is negative.  No significant diarrhea since admission. -Still having some intermittent abdominal pain but much improving.  General surgery signed off on 11/30/2022.  She is currently tolerating soft diet. -Off IV fluids.   -Continue Augmentin for 7 more days upon discharge.  Outpatient follow-up with PCP.  Might need GI evaluation in few weeks for possible outpatient colonoscopy in 6 to 8 weeks.     Hyponatremia -Mild.  Outpatient follow-up.  Hypokalemia -Improved   Hypertension -No documented history of hypertension and patient does not take antihypertensives at home.  Blood pressure extremely elevated.  Currently on amlodipine 10 mg daily.  Continue the same.  Microcytic anemia Iron deficiency anemia -Iron of 17 and iron saturation of 3.  Received 1 dose of IV iron on 11/28/2022.  Will need supplemental iron on discharge. -Hemoglobin on the lower side but stable.  No signs of bleeding   Thrombocytosis -Possibly reactive.  Monitor intermittently   Hypoglycemia -Improved.  Acute metabolic acidosis -Improved.   Chronic intrinsic asthma -Currently stable.  Continue as needed albuterol as needed.  On room air.  Discharge Instructions  Discharge Instructions     Ambulatory referral to Gastroenterology   Complete by: As directed    Hospital follow up for colitis   What is the reason for referral?: Other   Diet - low sodium heart healthy   Complete by: As directed    Soft diet   Increase activity slowly   Complete by: As directed       Allergies as of 12/01/2022       Reactions   Corn-containing Products Hives, Swelling   Pt states "I'm only allergic to corn on cob, Not foods or drinks containing corn syrup"   Erythromycin Hives   Other Hives   Peas    Peanuts [peanut Oil] Hives, Swelling        Medication List     STOP taking these medications    HYDROcodone-acetaminophen 5-325 MG tablet Commonly known as: NORCO/VICODIN   indomethacin 75 MG CR capsule Commonly known as: INDOCIN SR       TAKE these medications    albuterol  108 (90 Base) MCG/ACT inhaler Commonly known as: VENTOLIN HFA Inhale 1-2 puffs into the lungs every 6 (six) hours as needed for wheezing or shortness of breath.   amLODipine 10 MG tablet Commonly known as: NORVASC Take 1 tablet (10 mg total) by mouth daily. Start taking on: December 02, 2022   amoxicillin-clavulanate  875-125 MG tablet Commonly known as: AUGMENTIN Take 1 tablet by mouth 2 (two) times daily for 7 days.   clonazePAM 0.5 MG tablet Commonly known as: KLONOPIN Take 0.5 mg by mouth daily as needed for anxiety.   ferrous sulfate 325 (65 FE) MG EC tablet Take 1 tablet (325 mg total) by mouth 2 (two) times daily.   ipratropium-albuterol 0.5-2.5 (3) MG/3ML Soln Commonly known as: DUONEB Take 3 mLs by nebulization every 6 (six) hours as needed (for shortness of breath).   loratadine 10 MG tablet Commonly known as: CLARITIN Take 10 mg by mouth daily.   ondansetron 8 MG disintegrating tablet Commonly known as: ZOFRAN-ODT Take 1 tablet (8 mg total) by mouth every 8 (eight) hours as needed for nausea or vomiting.   oxyCODONE-acetaminophen 5-325 MG tablet Commonly known as: Percocet Take 1 tablet by mouth every 6 (six) hours as needed for severe pain. What changed:  when to take this reasons to take this   pantoprazole 40 MG tablet Commonly known as: PROTONIX Take 1 tablet (40 mg total) by mouth daily.   potassium chloride SA 20 MEQ tablet Commonly known as: KLOR-CON M Take 1 tablet (20 mEq total) by mouth 2 (two) times daily.   tranexamic acid 650 MG Tabs tablet Commonly known as: LYSTEDA Take 1,300 mg by mouth 3 (three) times daily.          Follow-up Information     Verlon Au, MD. Schedule an appointment as soon as possible for a visit in 1 week(s).   Specialty: Family Medicine Contact information: [address] [place] [address] [phone]                Allergies  Allergen Reactions   Corn-Containing Products Hives and Swelling    Pt states "I'm only allergic to corn on cob, Not foods or drinks containing corn syrup"   Erythromycin Hives   Other Hives    Peas    Peanuts [Peanut Oil] Hives and Swelling    Consultations: General surgery   Procedures/Studies: CT ABDOMEN PELVIS W CONTRAST  Result Date:  11/27/2022 CLINICAL DATA:  Right lower quadrant abdominal pain with nausea. EXAM: CT ABDOMEN AND PELVIS WITH CONTRAST TECHNIQUE: Multidetector CT imaging of the abdomen and pelvis was performed using the standard protocol following bolus administration of intravenous contrast. RADIATION DOSE REDUCTION: This exam was performed according to the departmental dose-optimization program which includes automated exposure control, adjustment of the mA and/or kV according to patient size and/or use of iterative reconstruction technique. CONTRAST:  OMNIPAQUE IOHEXOL 300 MG/ML  SOLN COMPARISON:  Ultrasound and CT 03/13/2022. Older CT examinations as well FINDINGS: Lower chest: There is some linear opacities along bases likely scar or atelectasis. No pleural effusion. Small hiatal hernia. Hepatobiliary: No focal liver abnormality is seen. No gallstones, gallbladder wall thickening, or biliary dilatation. Stable mild ectasia of the common duct but normal tapering towards the pancreatic head. Pancreas: Unremarkable. No pancreatic ductal dilatation or surrounding inflammatory changes. Spleen: Normal in size without focal abnormality. Adrenals/Urinary Tract: Adrenal glands are unremarkable. Kidneys are normal, without focal lesion, or hydronephrosis. Bladder is unremarkable. Punctate  nonobstructing right-sided renal stone. Stomach/Bowel: Stomach is underdistended with some luminal fluid. Small bowel is nondilated. Large bowel has a normal course and caliber with scattered colonic stool. Normal appendix. There is however some subtle wall thickening along the right side of the colon near the hepatic flexure with stranding, best seen on coronal images such as image 56 of series 8. Please correlate for a subtle colitis. Vascular/Lymphatic: No significant vascular findings are present. No enlarged abdominal or pelvic lymph nodes. Reproductive: Multilobular uterus consistent with multiple fibroids. There is also a cystic structure  in the left adnexa measuring 2.6 cm which is likely ovarian. By CT this appears relatively simple. No follow-up imaging recommended. Note: This recommendation does not apply to premenarchal patients and to those with increased risk (genetic, family history, elevated tumor markers or other high-risk factors) of ovarian cancer. Reference: JACR 2020 Feb; 17(2):248-254 Other: Trace free fluid in the pelvis. No free air. Epigastric midline anterior abdominal wall hernia with rectus muscle diastasis and involvement of the transverse colon. This a wide mouth hernia. This also is not seen on the prior exam. Musculoskeletal: Slight degenerative changes along the spine. IMPRESSION: Mild wall thickening and stranding along the hepatic flexure of the colon. Please correlate for a subtle colitis. No obstruction, free air.  Normal appendix. New epigastric midline anterior abdominal wall hernia involving a portion of the transverse colon and mesenteric fat. No obstruction. This a wide mouth hernia. Uterine fibroids. Punctate nonobstructing right-sided renal stone. Electronically Signed   By: Karen Kays M.D.   On: 11/27/2022 10:06      Subjective: Patient seen and examined at bedside.  Tolerating diet.  Still having intermittent lower abdominal pain but feels okay to go home today.  Denies any nausea or vomiting.  No fevers.  Discharge Exam: Vitals:   11/30/22 2038 12/01/22 0500  BP: (!) 170/101 (!) 140/95  Pulse: 93 89  Resp: 16 17  Temp: 98.6 F (37 C) 98.4 F (36.9 C)  SpO2: 100%     General: Pt is alert, awake, not in acute distress.  On room air. Cardiovascular: rate controlled, S1/S2 + Respiratory: bilateral decreased breath sounds at bases Abdominal: Soft, mildly tender right lower quadrant, slightly distended, bowel sounds + Extremities: no edema, no cyanosis    The results of significant diagnostics from this hospitalization (including imaging, microbiology, ancillary and laboratory) are listed  below for reference.     Microbiology: Recent Results (from the past 240 hour(s))  Urine Culture (for pregnant, neutropenic or urologic patients or patients with an indwelling urinary catheter)     Status: Abnormal   Collection Time: 11/27/22 12:06 PM   Specimen: Urine, Clean Catch  Result Value Ref Range Status   Specimen Description   Final    URINE, CLEAN CATCH Performed at [facility], [address], [place], [place] [address]    Special Requests   Final    NONE Performed at [facility], [address] [place], [place] [address]    Culture 20,000 COLONIES/mL ESCHERICHIA COLI (A)  Final   Report Status 11/29/2022 FINAL  Final   Organism ID, Bacteria ESCHERICHIA COLI (A)  Final      Susceptibility   Escherichia coli - MIC*    AMPICILLIN >=32 RESISTANT Resistant     CEFAZOLIN <=4 SENSITIVE Sensitive     CEFEPIME <=0.12 SENSITIVE Sensitive     CEFTRIAXONE <=0.25 SENSITIVE Sensitive     CIPROFLOXACIN <=0.25 SENSITIVE Sensitive  GENTAMICIN <=1 SENSITIVE Sensitive     IMIPENEM <=0.25 SENSITIVE Sensitive     NITROFURANTOIN <=16 SENSITIVE Sensitive     TRIMETH/SULFA >=320 RESISTANT Resistant     AMPICILLIN/SULBACTAM 16 INTERMEDIATE Intermediate     PIP/TAZO <=4 SENSITIVE Sensitive     * 20,000 COLONIES/mL ESCHERICHIA COLI  C Difficile Quick Screen w PCR reflex     Status: None   Collection Time: 11/27/22  1:33 PM   Specimen: STOOL  Result Value Ref Range Status   C Diff antigen NEGATIVE NEGATIVE Final   C Diff toxin NEGATIVE NEGATIVE Final   C Diff interpretation No C. difficile detected.  Final    Comment: Performed at [facility], [address] [place], [place] [address]  Culture, blood (Routine X 2) w Reflex to ID Panel     Status: None (Preliminary result)   Collection Time: 11/27/22  1:41 PM   Specimen: BLOOD  Result Value Ref Range Status   Specimen Description   Final    BLOOD RIGHT  ANTECUBITAL Performed at [facility], [address] [place], [place] [address]    Special Requests   Final    BOTTLES DRAWN AEROBIC AND ANAEROBIC Blood Culture adequate volume Performed at [facility], [address], [place], [place] [address]    Culture   Final    NO GROWTH 3 DAYS Performed at [facility], [address], [place], [place] [address]    Report Status PENDING  Incomplete  Culture, blood (Routine X 2) w Reflex to ID Panel     Status: None (Preliminary result)   Collection Time: 11/27/22  1:58 PM   Specimen: BLOOD  Result Value Ref Range Status   Specimen Description   Final    BLOOD LEFT ANTECUBITAL Performed at [facility], [address] [place], [place] [address]    Special Requests   Final    BOTTLES DRAWN AEROBIC AND ANAEROBIC Blood Culture adequate volume Performed at [facility], [address], [place], [place] [address]    Culture   Final    NO GROWTH 3 DAYS Performed at [facility], [address], [place], [place] [address]    Report Status PENDING  Incomplete     Labs: BNP (last 3 results) No results for input(s): "BNP" in the last 8760 hours. Basic Metabolic Panel: Recent Labs  Lab 11/27/22 1541 11/28/22 0533 11/29/22 0604 11/30/22 0411 12/01/22 0501  NA 135 133* 138 138 134*  K 3.5 3.1* 4.1 3.8 3.5  CL 106 104 112* 112* 104  CO2 18* 18* 19* 22 24  GLUCOSE 72 61* 110* 91 91  BUN 7 6 7  <5* <5*  CREATININE 0.58 0.70 0.65 0.55 0.65  CALCIUM 7.9* 8.0* 8.2* 8.3* 8.4*  MG  --   --  1.8 1.7 1.8   Liver Function Tests: Recent Labs  Lab 11/27/22 0837 11/28/22 0533 11/29/22 0604  AST 14* 12* 10*  ALT 14 11 10   ALKPHOS 58 59 47  BILITOT 0.6 0.5 0.3  PROT 6.7 5.9* 5.3*  ALBUMIN 3.9 3.2* 2.9*   Recent Labs  Lab 11/27/22 0837  LIPASE 28   No results for input(s): "AMMONIA" in the last 168 hours. CBC: Recent Labs  Lab 11/27/22 0837  11/27/22 1037 11/27/22 1541 11/28/22 0533 11/29/22 0604 11/30/22 0411 12/01/22 0501  WBC 20.7*  --  21.7* 20.2* 18.3* 11.9* 10.6*  NEUTROABS 16.2*  --  18.3*  --  14.4* 6.5 5.6  HGB 8.6*   < > 8.3* 7.8* 8.2* 7.3* 8.2*  HCT 27.7*   < > 28.4* 26.0* 28.6* 25.6* 27.5*  MCV 71.2*  --  74.7* 73.7* 76.7* 76.9* 75.8*  PLT 528*  --  496* 489* 510* 480* 523*   < > = values in this interval not displayed.   Cardiac Enzymes: No results for input(s): "CKTOTAL", "CKMB", "CKMBINDEX", "TROPONINI" in the last 168 hours. BNP: Invalid input(s): "POCBNP" CBG: No results for input(s): "GLUCAP" in the last 168 hours. D-Dimer No results for input(s): "DDIMER" in the last 72 hours. Hgb A1c No results for input(s): "HGBA1C" in the last 72 hours. Lipid Profile No results for input(s): "CHOL", "HDL", "LDLCALC", "TRIG", "CHOLHDL", "LDLDIRECT" in the last 72 hours. Thyroid function studies No results for input(s): "TSH", "T4TOTAL", "T3FREE", "THYROIDAB" in the last 72 hours.  Invalid input(s): "FREET3" Anemia work up No results for input(s): "VITAMINB12", "FOLATE", "FERRITIN", "TIBC", "IRON", "RETICCTPCT" in the last 72 hours. Urinalysis    Component Value Date/Time   COLORURINE STRAW (A) 11/27/2022 1206   APPEARANCEUR CLOUDY (A) 11/27/2022 1206   LABSPEC 1.043 (H) 11/27/2022 1206   PHURINE 6.0 11/27/2022 1206   GLUCOSEU NEGATIVE 11/27/2022 1206   HGBUR NEGATIVE 11/27/2022 1206   BILIRUBINUR NEGATIVE 11/27/2022 1206   KETONESUR 20 (A) 11/27/2022 1206   PROTEINUR NEGATIVE 11/27/2022 1206   UROBILINOGEN 1.0 11/19/2011 1840   NITRITE NEGATIVE 11/27/2022 1206   LEUKOCYTESUR TRACE (A) 11/27/2022 1206   Sepsis Labs Recent Labs  Lab 11/28/22 0533 11/29/22 0604 11/30/22 0411 12/01/22 0501  WBC 20.2* 18.3* 11.9* 10.6*   Microbiology Recent Results (from the past 240 hour(s))  Urine Culture (for pregnant, neutropenic or urologic patients or patients with an indwelling urinary catheter)      Status: Abnormal   Collection Time: 11/27/22 12:06 PM   Specimen: Urine, Clean Catch  Result Value Ref Range Status   Specimen Description   Final    URINE, CLEAN CATCH Performed at [facility], [address] [place], [place] [address]    Special Requests   Final    NONE Performed at [facility], [address] [place], [place] [address]    Culture 20,000 COLONIES/mL ESCHERICHIA COLI (A)  Final   Report Status 11/29/2022 FINAL  Final   Organism ID, Bacteria ESCHERICHIA COLI (A)  Final      Susceptibility   Escherichia coli - MIC*    AMPICILLIN >=32 RESISTANT Resistant     CEFAZOLIN <=4 SENSITIVE Sensitive     CEFEPIME <=0.12 SENSITIVE Sensitive     CEFTRIAXONE <=0.25 SENSITIVE Sensitive     CIPROFLOXACIN <=0.25 SENSITIVE Sensitive     GENTAMICIN <=1 SENSITIVE Sensitive     IMIPENEM <=0.25 SENSITIVE Sensitive     NITROFURANTOIN <=16 SENSITIVE Sensitive     TRIMETH/SULFA >=320 RESISTANT Resistant     AMPICILLIN/SULBACTAM 16 INTERMEDIATE Intermediate     PIP/TAZO <=4 SENSITIVE Sensitive     * 20,000 COLONIES/mL ESCHERICHIA COLI  C Difficile Quick Screen w PCR reflex     Status: None   Collection Time: 11/27/22  1:33 PM   Specimen: STOOL  Result Value Ref Range Status   C Diff antigen NEGATIVE NEGATIVE Final   C Diff toxin NEGATIVE NEGATIVE Final   C Diff interpretation No C. difficile detected.  Final    Comment: Performed at [facility], [address], [place], [place] [address]  Culture, blood (Routine  X 2) w Reflex to ID Panel     Status: None (Preliminary result)   Collection Time: 11/27/22  1:41 PM   Specimen: BLOOD  Result Value Ref Range Status   Specimen Description   Final    BLOOD RIGHT ANTECUBITAL Performed at [facility], [address], [place], [place] [address]    Special Requests   Final    BOTTLES DRAWN AEROBIC AND ANAEROBIC Blood Culture adequate volume Performed at [facility], [address], [place], [place] [address]    Culture   Final    NO GROWTH 3 DAYS Performed at [facility], [address], [place], [place] [address]    Report Status PENDING  Incomplete  Culture, blood (Routine X 2) w Reflex to ID Panel     Status: None (Preliminary result)   Collection Time: 11/27/22  1:58 PM   Specimen: BLOOD  Result Value Ref Range Status   Specimen Description   Final    BLOOD LEFT ANTECUBITAL Performed at [facility], [address] [place], [place] [address]    Special Requests   Final    BOTTLES DRAWN AEROBIC AND ANAEROBIC Blood Culture adequate volume Performed at [facility], [address], [place], [place] [address]    Culture   Final    NO GROWTH 3 DAYS Performed at [facility], [address], [place], [place] [address]    Report Status PENDING  Incomplete     Time coordinating discharge: 35 minutes  SIGNED:   Glade Lloyd, MD  [facility] 12/01/2022, 10:47 AM '

## 2022-12-01 NOTE — Plan of Care (Signed)

## 2023-02-23 ENCOUNTER — Encounter: Payer: Self-pay | Admitting: Gastroenterology

## 2023-04-28 ENCOUNTER — Ambulatory Visit: Payer: BC Managed Care – PPO | Admitting: Gastroenterology

## 2023-04-28 NOTE — Progress Notes (Deleted)
Chief Complaint: Hospital follow-up of colitis Primary GI MD: Gentry Fitz  HPI: 42 year old female history of asthma, recent myomectomy of multiple uterine fibroids, presents for hospital follow-up.  Recently seen in hospital 11/27/2018 for with RLQ pain accompanied by nausea and decreased appetite ongoing for 3 days.  Upon admission initial hemoglobin 8.6 with MCV 71.2.  Leukocytosis with elevated white count 20.7.  CRP 6.2. C diff negative.  CT abdomen pelvis with contrast showed mild wall thickening and stranding along the hepatic flexure of the colon.  New epigastric midline anterior abdominal wall hernia involving a portion of the transverse colon mesenteric fat with the opening was widemouth and no obstruction or strangulation noted.  Patient was started on IV fluids, IV Cipro and Flagyl with normalization of WBC .  General surgery was consulted and patient was managed conservatively and discharged on oral Augmentin   Discussed the use of AI scribe software for clinical note transcription with the patient, who gave verbal consent to proceed.  History of Present Illness             PREVIOUS GI WORKUP     Past Medical History:  Diagnosis Date   Asthma    Kidney stone     No past surgical history on file.  Current Outpatient Medications  Medication Sig Dispense Refill   albuterol (PROVENTIL HFA;VENTOLIN HFA) 108 (90 Base) MCG/ACT inhaler Inhale 1-2 puffs into the lungs every 6 (six) hours as needed for wheezing or shortness of breath. 1 Inhaler 0   amLODipine (NORVASC) 10 MG tablet Take 1 tablet (10 mg total) by mouth daily. 30 tablet 0   clonazePAM (KLONOPIN) 0.5 MG tablet Take 0.5 mg by mouth daily as needed for anxiety.     ferrous sulfate 325 (65 FE) MG EC tablet Take 1 tablet (325 mg total) by mouth 2 (two) times daily. 60 tablet 0   ipratropium-albuterol (DUONEB) 0.5-2.5 (3) MG/3ML SOLN Take 3 mLs by nebulization every 6 (six) hours as needed (for shortness of  breath).     loratadine (CLARITIN) 10 MG tablet Take 10 mg by mouth daily.     ondansetron (ZOFRAN-ODT) 8 MG disintegrating tablet Take 1 tablet (8 mg total) by mouth every 8 (eight) hours as needed for nausea or vomiting. (Patient not taking: Reported on 11/27/2022) 20 tablet 0   oxyCODONE-acetaminophen (PERCOCET) 5-325 MG tablet Take 1 tablet by mouth every 6 (six) hours as needed for severe pain. 20 tablet 0   pantoprazole (PROTONIX) 40 MG tablet Take 1 tablet (40 mg total) by mouth daily. 15 tablet 0   potassium chloride SA (KLOR-CON M) 20 MEQ tablet Take 1 tablet (20 mEq total) by mouth 2 (two) times daily. 10 tablet 0   tranexamic acid (LYSTEDA) 650 MG TABS tablet Take 1,300 mg by mouth 3 (three) times daily.     No current facility-administered medications for this visit.    Allergies as of 04/28/2023 - Review Complete 11/27/2022  Allergen Reaction Noted   Corn-containing products Hives and Swelling 04/25/2015   Erythromycin Hives 11/19/2011   Other Hives 04/25/2015   Peanuts [peanut oil] Hives and Swelling 04/25/2015    Family History  Problem Relation Age of Onset   Diabetes Mother    Heart disease Mother    Hyperlipidemia Father    Diabetes Maternal Grandmother    Heart disease Maternal Grandmother    Diabetes Paternal Grandfather    Heart disease Paternal Grandfather    Hyperlipidemia Paternal Grandfather     Social  History   Socioeconomic History   Marital status: Married    Spouse name: Not on file   Number of children: Not on file   Years of education: Not on file   Highest education level: Not on file  Occupational History   Not on file  Tobacco Use   Smoking status: Every Day    Current packs/day: 0.50    Types: Cigarettes   Smokeless tobacco: Never  Substance and Sexual Activity   Alcohol use: Yes    Comment: occasionally   Drug use: No   Sexual activity: Yes  Other Topics Concern   Not on file  Social History Narrative   Not on file   Social  Determinants of Health   Financial Resource Strain: Not on file  Food Insecurity: Low Risk  (01/28/2023)   Received from [facility]   Hunger Vital Sign    Worried About Running Out of Food in the Last Year: Never true    Ran Out of Food in the Last Year: Never true  Transportation Needs: No Transportation Needs (01/28/2023)   Received from [facility]    In the past 12 months, has lack of reliable transportation kept you from medical appointments, meetings, work or from getting things needed for daily living? : No  Physical Activity: Not on file  Stress: Not on file  Social Connections: Not on file  Intimate Partner Violence: Not on file    Review of Systems:    Constitutional: No weight loss, fever, chills, weakness or fatigue HEENT: Eyes: No change in vision               Ears, Nose, Throat:  No change in hearing or congestion Skin: No rash or itching Cardiovascular: No chest pain, chest pressure or palpitations   Respiratory: No SOB or cough Gastrointestinal: See HPI and otherwise negative Genitourinary: No dysuria or change in urinary frequency Neurological: No headache, dizziness or syncope Musculoskeletal: No new muscle or joint pain Hematologic: No bleeding or bruising Psychiatric: No history of depression or anxiety    Physical Exam:  Vital signs: There were no vitals taken for this visit.  Constitutional: NAD, Well developed, Well nourished, alert and cooperative Head:  Normocephalic and atraumatic. Eyes:   PEERL, EOMI. No icterus. Conjunctiva pink. Respiratory: Respirations even and unlabored. Lungs clear to auscultation bilaterally.   No wheezes, crackles, or rhonchi.  Cardiovascular:  Regular rate and rhythm. No peripheral edema, cyanosis or pallor.  Gastrointestinal:  Soft, nondistended, nontender. No rebound or guarding. Normal bowel sounds. No appreciable masses or hepatomegaly. Rectal:  Not performed.  Msk:  Symmetrical without gross  deformities. Without edema, no deformity or joint abnormality.  Neurologic:  Alert and  oriented x4;  grossly normal neurologically.  Skin:   Dry and intact without significant lesions or rashes. Psychiatric: Oriented to person, place and time. Demonstrates good judgement and reason without abnormal affect or behaviors.  Physical Exam           RELEVANT LABS AND IMAGING: CBC    Component Value Date/Time   WBC 10.6 (H) 12/01/2022 0501   RBC 3.63 (L) 12/01/2022 0501   HGB 8.2 (L) 12/01/2022 0501   HCT 27.5 (L) 12/01/2022 0501   PLT 523 (H) 12/01/2022 0501   MCV 75.8 (L) 12/01/2022 0501   MCV 98.9 (A) 05/02/2013 0906   MCH 22.6 (L) 12/01/2022 0501   MCHC 29.8 (L) 12/01/2022 0501   RDW 15.6 (H) 12/01/2022 0501  LYMPHSABS 3.1 12/01/2022 0501   MONOABS 0.9 12/01/2022 0501   EOSABS 0.9 (H) 12/01/2022 0501   BASOSABS 0.1 12/01/2022 0501    CMP     Component Value Date/Time   NA 134 (L) 12/01/2022 0501   K 3.5 12/01/2022 0501   CL 104 12/01/2022 0501   CO2 24 12/01/2022 0501   GLUCOSE 91 12/01/2022 0501   BUN <5 (L) 12/01/2022 0501   CREATININE 0.65 12/01/2022 0501   CALCIUM 8.4 (L) 12/01/2022 0501   PROT 5.3 (L) 11/29/2022 0604   ALBUMIN 2.9 (L) 11/29/2022 0604   AST 10 (L) 11/29/2022 0604   ALT 10 11/29/2022 0604   ALKPHOS 47 11/29/2022 0604   BILITOT 0.3 11/29/2022 0604   GFRNONAA >60 12/01/2022 0501   GFRAA >60 04/25/2015 1105     Assessment/Plan:   Assessment and Plan                Chelcie Estorga [facility] 04/28/2023, 8:39 AM  Cc: Verlon Au, MD
